# Patient Record
Sex: Female | Born: 1965
Health system: Southern US, Community
[De-identification: ages and names within clinical notes are randomized; demographics above are authoritative.]

## PROBLEM LIST (undated history)

## (undated) DIAGNOSIS — F419 Anxiety disorder, unspecified: Secondary | ICD-10-CM

## (undated) DIAGNOSIS — E559 Vitamin D deficiency, unspecified: Secondary | ICD-10-CM

## (undated) HISTORY — DX: Vitamin D deficiency, unspecified: E55.9

---

## 2003-09-20 HISTORY — PX: CYST EXCISION: SHX5701

## 2012-07-20 ENCOUNTER — Emergency Department (HOSPITAL_COMMUNITY): Payer: 59

## 2012-07-20 ENCOUNTER — Emergency Department (HOSPITAL_COMMUNITY)
Admission: EM | Admit: 2012-07-20 | Discharge: 2012-07-20 | Disposition: A | Discharge status: Home / Self Care | Payer: 59 | Attending: Emergency Medicine | Admitting: Emergency Medicine

## 2012-07-20 ENCOUNTER — Encounter (HOSPITAL_COMMUNITY): Payer: Self-pay

## 2012-07-20 DIAGNOSIS — X500XXA Overexertion from strenuous movement or load, initial encounter: Secondary | ICD-10-CM | POA: Insufficient documentation

## 2012-07-20 DIAGNOSIS — S8991XA Unspecified injury of right lower leg, initial encounter: Secondary | ICD-10-CM

## 2012-07-20 DIAGNOSIS — S86809A Unspecified injury of other muscle(s) and tendon(s) at lower leg level, unspecified leg, initial encounter: Secondary | ICD-10-CM

## 2012-07-20 DIAGNOSIS — Y9302 Activity, running: Secondary | ICD-10-CM | POA: Insufficient documentation

## 2012-07-20 DIAGNOSIS — S8990XA Unspecified injury of unspecified lower leg, initial encounter: Secondary | ICD-10-CM | POA: Insufficient documentation

## 2012-07-20 DIAGNOSIS — Y92410 Unspecified street and highway as the place of occurrence of the external cause: Secondary | ICD-10-CM | POA: Insufficient documentation

## 2012-07-20 MED ORDER — HYDROCODONE-ACETAMINOPHEN 5-325 MG PO TABS: ORAL_TABLET | ORAL | Status: DC

## 2012-07-20 MED ORDER — NAPROXEN 500 MG PO TABS: 500.0000 mg | ORAL_TABLET | Freq: Two times a day (BID) | ORAL | Status: DC

## 2012-07-20 NOTE — ED Notes (Signed)
Pt c/o right lower leg pain for 2 weeks began hurting worse after running the previous day.

## 2012-07-20 NOTE — ED Provider Notes (Signed)
History     CSN: [id number]  Arrival date & time 07/20/12  1478   First MD Initiated Contact with Patient 07/20/12 [phone]      Chief Complaint  Patient presents with  . Leg Pain    (Consider location/radiation/quality/duration/timing/severity/associated sxs/prior treatment) HPI Comments: Patient c/o pain to her right lower leg that began suddenly on the evening prior to ED arrival.  States that she began jogging down a hill while trick or treating with her child and felt a sharp, sudden pain to her right lower leg.  States she also felt a "pop" in her leg and she was unable to bear weight to the leg.  She denies numbness, redness or swelling or her leg.  She denies hx of smoking or previous DVT.    Patient is a 46 y.o. female presenting with leg pain. The history is provided by the patient.  Leg Pain  Incident onset: on the evening PTA. The incident occurred in the street. The pain is present in the right leg. The quality of the pain is described as throbbing and aching. The pain is moderate. The pain has been constant since onset. Associated symptoms include inability to bear weight. Pertinent negatives include no numbness, no loss of motion, no muscle weakness, no loss of sensation and no tingling. She reports no foreign bodies present. The symptoms are aggravated by activity, palpation and bearing weight. She has tried NSAIDs, ice and elevation for the symptoms. The treatment provided no relief.    History reviewed. No pertinent past medical history.  History reviewed. No pertinent past surgical history.  History reviewed. No pertinent family history.  History  Substance Use Topics  . Smoking status: Never Smoker   . Smokeless tobacco: Not on file  . Alcohol Use: Yes    OB History    Grav Para Term Preterm Abortions TAB SAB Ect Mult Living                  Review of Systems  Constitutional: Negative for fever and chills.  HENT: Negative for neck pain.   Respiratory:  Negative for shortness of breath.   Cardiovascular: Negative for chest pain.  Gastrointestinal: Negative for abdominal pain.  Genitourinary: Negative for dysuria and difficulty urinating.  Musculoskeletal: Positive for myalgias and gait problem. Negative for back pain, joint swelling and arthralgias.  Skin: Negative for color change and wound.  Neurological: Negative for tingling, weakness and numbness.  All other systems reviewed and are negative.    Allergies  Review of patient's allergies indicates no known allergies.  Home Medications  No current outpatient prescriptions on file.  BP 127/87  Pulse 95  Temp 97.9 F (36.6 C) (Oral)  Resp 20  SpO2 98%  LMP 07/13/2012  Physical Exam  Nursing note and vitals reviewed. Constitutional: She is oriented to person, place, and time. She appears well-developed and well-nourished. No distress.  HENT:  Head: Normocephalic and atraumatic.  Cardiovascular: Normal rate, regular rhythm, normal heart sounds and intact distal pulses.   No murmur heard. Pulmonary/Chest: Effort normal and breath sounds normal. No respiratory distress.  Musculoskeletal: She exhibits tenderness. She exhibits no edema.       Right lower leg: She exhibits tenderness. She exhibits no bony tenderness, no swelling, no edema, no deformity and no laceration.       Legs:      Localized ttp of the medial aspect of the right lower leg. Mild STS.  Thompson test is negative.  DP pulse  brisk, distal sensation intact, no erythema, bruising or excessive warmth  Neurological: She is alert and oriented to person, place, and time. She exhibits normal muscle tone. Coordination normal.  Skin: Skin is warm and dry.    ED Course  Procedures (including critical care time)  Labs Reviewed - No data to display  Mr Tibia Fibula Right Wo Contrast  07/20/2012  *RADIOLOGY REPORT*  Clinical Data: Right calf pain following injury running.  MRI OF THE RIGHT LOWER LEG WITHOUT CONTRAST   Technique:  Multiplanar, multisequence MR imaging of the lower right extremity was performed. No intravenous contrast was administered.  Comparison: None.  Findings: Study was performed using the body coil.  Both lower legs are included on the axial and coronal images.  There is moderate edema and probable hemorrhage in the mid right calf between the soleus muscle and the medial head of the gastrocnemius muscle.  This has a slightly tubular shape and is most consistent with plantaris tendon rupture. There is mild edema within the medial head of the gastrocnemius muscle distally.  The Achilles tendon appears normal.  No other fluid collections are identified.  There are no vascular abnormalities.  The right tibia and fibula appear normal.  Incidental imaging of the left lower leg demonstrates no abnormality.  IMPRESSION: Right lower leg plantaris rupture with typical edema/hemorrhage between the soleus and gastrocnemius muscles as described.   Original Report Authenticated By: Carey Bullocks, M.D.      Knee immobilizer applied, pain improved.  Remains NV intact.    MDM    Wells score for DVT is -1,  Low risk group for DVT  MR results discussed with pt.     10:00 am Consulted Dr. Hilda Lias.  Recommended MRI, knee immobilizer and he will see pt in his office on Monday  Rx: Naprosyn norco #24   Ramonita Koenig L. Kartier Bennison, PA 07/21/12 2142  Tegan Burnside L. [place], [place] 07/21/12 2143

## 2012-07-20 NOTE — ED Notes (Signed)
Pt reports pain primarily to her rt calf.  Pt reports pain increases upon movement.  There was no redness or swelling noted.

## 2012-07-28 NOTE — ED Provider Notes (Signed)
Medical screening examination/treatment/procedure(s) were performed by non-physician practitioner and as supervising physician I was immediately available for consultation/collaboration. Devoria Albe, MD, Armando Gang   Ward Givens, MD 07/28/12 [phone]

## 2013-08-19 ENCOUNTER — Encounter: Payer: Self-pay | Admitting: Family Medicine

## 2013-08-19 ENCOUNTER — Ambulatory Visit (INDEPENDENT_AMBULATORY_CARE_PROVIDER_SITE_OTHER): Payer: 59 | Admitting: Family Medicine

## 2013-08-19 VITALS — BP 114/88 | Temp 98.8°F | Ht 65.5 in | Wt 166.2 lb

## 2013-08-19 DIAGNOSIS — J329 Chronic sinusitis, unspecified: Secondary | ICD-10-CM

## 2013-08-19 DIAGNOSIS — J310 Chronic rhinitis: Secondary | ICD-10-CM

## 2013-08-19 MED ORDER — CEFDINIR 300 MG PO CAPS: 300.0000 mg | ORAL_CAPSULE | Freq: Two times a day (BID) | ORAL | Status: DC

## 2013-08-19 NOTE — Progress Notes (Signed)
   Subjective:    Patient ID: Amanda Woodard, female    DOB: Jan 06, 1966, 47 y.o.   MRN: [id number]  Sinusitis This is a new problem. The current episode started in the past 7 days. Associated symptoms include congestion, coughing, ear pain, headaches, a hoarse voice and sinus pressure. Past treatments include acetaminophen. The treatment provided mild relief.  med ankle pain on the left Recalls no injury to ankle. Pain sharp at times. Due to fly today. Works as a Financial controller  Low gr fever Wheezing at times at times with exertion, nose running diminished endrgy  Review of Systems  HENT: Positive for congestion, ear pain, hoarse voice and sinus pressure.   Respiratory: Positive for cough.   Neurological: Positive for headaches.       Objective:   Physical Exam   alert mild malaise. H&T moderate his congestion frontal and maxillary tenderness pharynx normal neck supple lungs clear heart rare rhythm medial left ankle swollen tender to palpation malleolus nontender      Assessment & Plan:  Impression 1 rhinosinusitis #2 tenia synovitis plan antibiotics prescribed. Since Medicare discussed. WSL

## 2014-05-22 ENCOUNTER — Encounter: Payer: Self-pay | Admitting: Nurse Practitioner

## 2014-05-22 ENCOUNTER — Ambulatory Visit (INDEPENDENT_AMBULATORY_CARE_PROVIDER_SITE_OTHER): Payer: 59 | Admitting: Nurse Practitioner

## 2014-05-22 VITALS — BP 116/82 | Ht 65.5 in | Wt 168.0 lb

## 2014-05-22 DIAGNOSIS — T148 Other injury of unspecified body region: Secondary | ICD-10-CM

## 2014-05-22 DIAGNOSIS — M722 Plantar fascial fibromatosis: Secondary | ICD-10-CM

## 2014-05-22 DIAGNOSIS — W57XXXA Bitten or stung by nonvenomous insect and other nonvenomous arthropods, initial encounter: Secondary | ICD-10-CM

## 2014-05-22 NOTE — Progress Notes (Signed)
Subjective:  Presents for complaints of a rash on her left lower leg for the past several days. Began after doing some yard work, was wearing yoga pants. Localized to the left lower leg. Nonpruritic. Slightly tender. Has been applying clobetasol. Initially slightly pink and swollen, this has improved. Also complaints of pain in the sole of the right foot near the heel, localized. No specific history of injury. Worse when she first puts weight on her foot first thing in the morning or after sitting for a long period of time. No fever. No headache. No joint pain.  Objective:   BP 116/82  Ht 5' 5.5" (1.664 m)  Wt 168 lb (76.204 kg)  BMI 27.52 kg/m2 NAD. Alert, oriented. Cluster of flat open healing papules with mild surrounding faint purplish discoloration. Minimal edema around area. Nontender to palpation. Located on the left lower posterior leg. Right heel localized tenderness noted at the distal portion of the heel in the arch of the foot. Patient wearing hard soled sandals with minimal arch support. Moderate arch noted in the foot.  Assessment: Plantar fasciitis of right foot  Insect bites  Plan: May continue clobetasol, expect continued gradual resolution of rash. Given written and verbal information on plantar fasciitis. Recommend referral to podiatry if symptoms persist. Call back if any further problems.

## 2014-05-22 NOTE — Patient Instructions (Signed)
Plantar Fasciitis  Plantar fasciitis is a common condition that causes foot pain. It is soreness (inflammation) of the band of tough fibrous tissue on the bottom of the foot that runs from the heel bone (calcaneus) to the ball of the foot. The cause of this soreness may be from excessive standing, poor fitting shoes, running on hard surfaces, being overweight, having an abnormal walk, or overuse (this is common in runners) of the painful foot or feet. It is also common in aerobic exercise dancers and ballet dancers.  SYMPTOMS   Most people with plantar fasciitis complain of:   Severe pain in the morning on the bottom of their foot especially when taking the first steps out of bed. This pain recedes after a few minutes of walking.   Severe pain is experienced also during walking following a long period of inactivity.   Pain is worse when walking barefoot or up stairs  DIAGNOSIS    Your caregiver will diagnose this condition by examining and feeling your foot.   Special tests such as X-rays of your foot, are usually not needed.  PREVENTION    Consult a sports medicine professional before beginning a new exercise program.   Walking programs offer a good workout. With walking there is a lower chance of overuse injuries common to runners. There is less impact and less jarring of the joints.   Begin all new exercise programs slowly. If problems or pain develop, decrease the amount of time or distance until you are at a comfortable level.   Wear good shoes and replace them regularly.   Stretch your foot and the heel cords at the back of the ankle (Achilles tendon) both before and after exercise.   Run or exercise on even surfaces that are not hard. For example, asphalt is better than pavement.   Do not run barefoot on hard surfaces.   If using a treadmill, vary the incline.   Do not continue to workout if you have foot or joint problems. Seek professional help if they do not improve.  HOME CARE INSTRUCTIONS     Avoid activities that cause you pain until you recover.   Use ice or cold packs on the problem or painful areas after working out.   Only take over-the-counter or prescription medicines for pain, discomfort, or fever as directed by your caregiver.   Soft shoe inserts or athletic shoes with air or gel sole cushions may be helpful.   If problems continue or become more severe, consult a sports medicine caregiver or your own health care provider. Cortisone is a potent anti-inflammatory medication that may be injected into the painful area. You can discuss this treatment with your caregiver.  MAKE SURE YOU:    Understand these instructions.   Will watch your condition.   Will get help right away if you are not doing well or get worse.  Document Released: 05/31/2001 Document Revised: 11/28/2011 Document Reviewed: 07/30/2008  ExitCare Patient Information 2015 [employer]. This information is not intended to replace advice given to you by your health care provider. Make sure you discuss any questions you have with your health care provider.

## 2014-06-27 ENCOUNTER — Telehealth: Payer: Self-pay | Admitting: Nurse Practitioner

## 2014-06-27 NOTE — Telephone Encounter (Signed)
Patient needs order for blood work. °

## 2014-07-02 ENCOUNTER — Telehealth: Payer: Self-pay | Admitting: *Deleted

## 2014-07-02 ENCOUNTER — Other Ambulatory Visit: Payer: Self-pay | Admitting: Nurse Practitioner

## 2014-07-02 DIAGNOSIS — Z0000 Encounter for general adult medical examination without abnormal findings: Secondary | ICD-10-CM

## 2014-07-02 DIAGNOSIS — Z13220 Encounter for screening for lipoid disorders: Secondary | ICD-10-CM

## 2014-07-02 NOTE — Telephone Encounter (Signed)
Pt notified lab orders are ready.

## 2014-07-02 NOTE — Telephone Encounter (Signed)
Pt notified orders ready.

## 2014-07-02 NOTE — Telephone Encounter (Signed)
Labs ordered.

## 2014-07-29 LAB — LIPID PANEL
Cholesterol: 159 mg/dL (ref 0–200)
HDL: 42 mg/dL (ref >39)
LDL CALC: 94 mg/dL (ref 0–99)
Total CHOL/HDL Ratio: 3.8 Ratio
Triglycerides: 114 mg/dL (ref <150)
VLDL: 23 mg/dL (ref 0–40)

## 2014-07-29 LAB — CBC WITH DIFFERENTIAL/PLATELET
Basophils Absolute: 0 10*3/uL (ref 0.0–0.1)
Basophils Relative: 0 % (ref 0–1)
EOS PCT: 2 % (ref 0–5)
Eosinophils Absolute: 0.1 10*3/uL (ref 0.0–0.7)
HEMATOCRIT: 46.4 % — AB (ref 36.0–46.0)
HEMOGLOBIN: 15.2 g/dL — AB (ref 12.0–15.0)
LYMPHS PCT: 31 % (ref 12–46)
Lymphs Abs: 1.6 10*3/uL (ref 0.7–4.0)
MCH: 30.7 pg (ref 26.0–34.0)
MCHC: 32.8 g/dL (ref 30.0–36.0)
MCV: 93.7 fL (ref 78.0–100.0)
MONO ABS: 0.5 10*3/uL (ref 0.1–1.0)
MONOS PCT: 10 % (ref 3–12)
NEUTROS ABS: 3 10*3/uL (ref 1.7–7.7)
Neutrophils Relative %: 57 % (ref 43–77)
Platelets: 225 10*3/uL (ref 150–400)
RBC: 4.95 MIL/uL (ref 3.87–5.11)
RDW: 12.9 % (ref 11.5–15.5)
WBC: 5.3 10*3/uL (ref 4.0–10.5)

## 2014-07-29 LAB — BASIC METABOLIC PANEL
BUN: 15 mg/dL (ref 6–23)
CALCIUM: 9.2 mg/dL (ref 8.4–10.5)
CO2: 27 meq/L (ref 19–32)
Chloride: 106 mEq/L (ref 96–112)
Creat: 0.74 mg/dL (ref 0.50–1.10)
GLUCOSE: 95 mg/dL (ref 70–99)
POTASSIUM: 4.5 meq/L (ref 3.5–5.3)
SODIUM: 139 meq/L (ref 135–145)

## 2014-07-29 LAB — HEPATIC FUNCTION PANEL
ALK PHOS: 29 U/L — AB (ref 39–117)
ALT: 18 U/L (ref 0–35)
AST: 14 U/L (ref 0–37)
Albumin: 4.4 g/dL (ref 3.5–5.2)
BILIRUBIN DIRECT: 0.1 mg/dL (ref 0.0–0.3)
BILIRUBIN INDIRECT: 0.4 mg/dL (ref 0.2–1.2)
TOTAL PROTEIN: 6.6 g/dL (ref 6.0–8.3)
Total Bilirubin: 0.5 mg/dL (ref 0.2–1.2)

## 2014-07-29 LAB — TSH: TSH: 1.173 u[IU]/mL (ref 0.350–4.500)

## 2014-07-30 LAB — VITAMIN D 25 HYDROXY (VIT D DEFICIENCY, FRACTURES): VIT D 25 HYDROXY: 30 ng/mL (ref 30–89)

## 2014-08-06 ENCOUNTER — Encounter: Payer: 59 | Admitting: Nurse Practitioner

## 2014-08-07 ENCOUNTER — Encounter: Payer: Self-pay | Admitting: Nurse Practitioner

## 2014-08-07 ENCOUNTER — Ambulatory Visit (INDEPENDENT_AMBULATORY_CARE_PROVIDER_SITE_OTHER): Payer: 59 | Admitting: Nurse Practitioner

## 2014-08-07 VITALS — BP 124/80 | Ht 65.0 in | Wt 169.2 lb

## 2014-08-07 DIAGNOSIS — Z0000 Encounter for general adult medical examination without abnormal findings: Secondary | ICD-10-CM

## 2014-08-07 DIAGNOSIS — Z23 Encounter for immunization: Secondary | ICD-10-CM

## 2014-08-07 DIAGNOSIS — G4700 Insomnia, unspecified: Secondary | ICD-10-CM

## 2014-08-07 NOTE — Patient Instructions (Signed)
Weight Watchers 21 day fix

## 2014-08-07 NOTE — Progress Notes (Addendum)
Subjective:  Presents for recheck and discuss labs. Having insomnia a few nights per week; relieved with 1/2-1 Xanax 0.5 at bedtime. Prescribed sixty by GYN. Tries to use on a rare basis. Works as a Catering manager. Gets mammogram and physicals every August with gyn in [place].  Objective:   BP 124/80 mmHg  Ht 5\' 5"  (1.651 m)  Wt 169 lb 4 oz (76.771 kg)  BMI 28.16 kg/m2 NAD. Alert. Oriented. Cheerful affect. Lungs clear. Heart RRR. Reviewed labs from 07/29/14 with patient. Form filled out for work.   Assessment: Insomnia  Need for vaccination - Plan: Flu Vaccine QUAD 36+ mos PF IM (Fluarix Quad PF)  Plan: Meds ordered this encounter  Medications  . ALPRAZolam (XANAX) 0.5 MG tablet    Sig: 0.5 mg at bedtime as needed.    Refill:  0   Use Xanax up to 2-3 times per week. Recheck if insomnia or stress worsens.   See phone call dated 09/30/14. Patient has been advised that her insurance may not pay for one of the visits depending on how her GYN is coding. We did review labs and general screening recommended for patient as a request from her employer. Also forms for her physical were filled out for work.

## 2014-08-23 ENCOUNTER — Encounter: Payer: Self-pay | Admitting: *Deleted

## 2014-09-30 ENCOUNTER — Telehealth: Payer: Self-pay | Admitting: Nurse Practitioner

## 2014-09-30 NOTE — Telephone Encounter (Signed)
Patient was seen on 08/07/2014 for her annual exam she says.  The diagnosis was put in as insomnia, so her HSA account came back and took care of the balance.  Patient says this was supposed to be a routine exam so that her insurance would cover 100%.  In the office notes, it does say that she gets her mammograms and paps in [place] at gynecologist, but patient says this does not include her routine physical exams.  She wants to know if Hoyle Sauer can change the diagnosis to represent that this was a routine physical exam?  Please call when this has been completed.

## 2015-05-28 ENCOUNTER — Other Ambulatory Visit (HOSPITAL_COMMUNITY)
Admission: RE | Admit: 2015-05-28 | Discharge: 2015-05-28 | Disposition: A | Discharge status: Home / Self Care | Payer: 59 | Source: Ambulatory Visit | Attending: Family Medicine | Admitting: Family Medicine

## 2015-05-28 ENCOUNTER — Ambulatory Visit (INDEPENDENT_AMBULATORY_CARE_PROVIDER_SITE_OTHER): Payer: 59 | Admitting: Family Medicine

## 2015-05-28 ENCOUNTER — Ambulatory Visit (HOSPITAL_COMMUNITY)
Admission: RE | Admit: 2015-05-28 | Discharge: 2015-05-28 | Disposition: A | Discharge status: Home / Self Care | Payer: 59 | Source: Ambulatory Visit | Attending: Family Medicine | Admitting: Family Medicine

## 2015-05-28 ENCOUNTER — Encounter: Payer: Self-pay | Admitting: Family Medicine

## 2015-05-28 VITALS — BP 112/68 | Ht 65.0 in | Wt 165.0 lb

## 2015-05-28 DIAGNOSIS — M79661 Pain in right lower leg: Secondary | ICD-10-CM | POA: Diagnosis not present

## 2015-05-28 LAB — D-DIMER, QUANTITATIVE: D-Dimer, Quant: <0.27 ug/mL-FEU (ref 0.00–0.48)

## 2015-05-28 NOTE — Progress Notes (Signed)
   Subjective:    Patient ID: Amanda Woodard, female    DOB: 19-Nov-1965, 49 y.o.   MRN: [id number]  HPI Patient arrives with c/o painful veins on back of her right leg. Past 5 days with aching Sx come and go Worse at night The pain is been in the superior posterior portion of the calf for no good reason over the past 5 days she is noticed it several times where aches really bad. 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  Review of Systems Denies wheezing chills fevers cough hemoptysis    Objective:   Physical Exam  On physical examination lungs clear hearts regular pulse normal there is some tenderness behind the right leg with some tenderness in the superior portion of the calf. Left leg nontender. There does not appear to be any significant swelling.      Assessment & Plan:  With the Pain and tenderness I believe this patient does need d-dimer as well as ultrasound of this we await the results of this to help rule out DVT if DVT is negative then will use anti-inflammatory some stretching exercises  It should be noted that ultrasound that negative for DVT. D-dimer came back negative. No need for further intervention. Stretching exercises and time for laboratory's may be done. Patient was spoken with via phone. Follow-up if problems.

## 2015-10-27 ENCOUNTER — Ambulatory Visit (INDEPENDENT_AMBULATORY_CARE_PROVIDER_SITE_OTHER): Payer: 59 | Admitting: Family Medicine

## 2015-10-27 ENCOUNTER — Encounter: Payer: Self-pay | Admitting: Family Medicine

## 2015-10-27 VITALS — BP 112/70 | Temp 98.2°F | Ht 65.0 in | Wt 167.0 lb

## 2015-10-27 DIAGNOSIS — J019 Acute sinusitis, unspecified: Secondary | ICD-10-CM

## 2015-10-27 DIAGNOSIS — B9689 Other specified bacterial agents as the cause of diseases classified elsewhere: Secondary | ICD-10-CM

## 2015-10-27 DIAGNOSIS — H6981 Other specified disorders of Eustachian tube, right ear: Secondary | ICD-10-CM | POA: Diagnosis not present

## 2015-10-27 MED ORDER — CEFPROZIL 500 MG PO TABS: 500.0000 mg | ORAL_TABLET | Freq: Two times a day (BID) | ORAL | Status: DC

## 2015-10-27 NOTE — Progress Notes (Signed)
   Subjective:    Patient ID: Amanda Woodard, female    DOB: 11-06-1965, 50 y.o.   MRN: [id number]  Sinusitis This is a new problem. The current episode started in the past 7 days. The problem is unchanged. The pain is moderate. Associated symptoms include congestion, coughing, ear pain and sneezing. Pertinent negatives include no shortness of breath. Treatments tried: allergy medicine. The treatment provided no relief.   Patient states that she has no other concerns at this time.    patients had some head congestion drainage coughing a little bit of hoarseness denies high fever chills sweats nausea vomiting relates it have some inner ears symptoms but this got better   Review of Systems  Constitutional: Negative for fever and activity change.  HENT: Positive for congestion, ear pain, rhinorrhea and sneezing.   Eyes: Negative for discharge.  Respiratory: Positive for cough. Negative for shortness of breath and wheezing.   Cardiovascular: Negative for chest pain.       Objective:   Physical Exam  Constitutional: She appears well-developed.  HENT:  Head: Normocephalic.  Nose: Nose normal.  Mouth/Throat: Oropharynx is clear and moist. No oropharyngeal exudate.  Neck: Neck supple.  Cardiovascular: Normal rate and normal heart sounds.   No murmur heard. Pulmonary/Chest: Effort normal and breath sounds normal. She has no wheezes.  Lymphadenopathy:    She has no cervical adenopathy.  Skin: Skin is warm and dry.  Nursing note and vitals reviewed.         Assessment & Plan:   eustachian tube dysfunction should gradually get better may use nasal decongestant if necessary Sinusitis antibiotics prescribed warning signs discussed follow-up if problems

## 2016-05-18 ENCOUNTER — Encounter: Payer: Self-pay | Admitting: Family Medicine

## 2016-05-18 ENCOUNTER — Ambulatory Visit (INDEPENDENT_AMBULATORY_CARE_PROVIDER_SITE_OTHER): Payer: 59 | Admitting: Family Medicine

## 2016-05-18 VITALS — BP 118/76 | Temp 98.3°F | Ht 65.5 in | Wt 169.0 lb

## 2016-05-18 DIAGNOSIS — J019 Acute sinusitis, unspecified: Secondary | ICD-10-CM

## 2016-05-18 DIAGNOSIS — B9689 Other specified bacterial agents as the cause of diseases classified elsewhere: Secondary | ICD-10-CM

## 2016-05-18 MED ORDER — AMOXICILLIN-POT CLAVULANATE 875-125 MG PO TABS: 1.0000 | ORAL_TABLET | Freq: Two times a day (BID) | ORAL | 0 refills | Status: AC

## 2016-05-18 NOTE — Progress Notes (Signed)
   Subjective:    Patient ID: Amanda Woodard, female    DOB: 05-05-1966, 50 y.o.   MRN: [id number]  Sinusitis  This is a new problem. Episode onset: 3 days. Associated symptoms include congestion, coughing, ear pain, headaches and a sore throat. Treatments tried: zyrtec, ibuprofen, benadryl.   Muffled  Headache  Pressure frontal dim energy     Using zyrtec, bendadryl and   No know n exposure  No fam stuff     Review of Systems  HENT: Positive for congestion, ear pain and sore throat.   Respiratory: Positive for cough.   Neurological: Positive for headaches.       Objective:   Physical Exam Alert, mild malaise. Hydration good Vitals stable. frontal/ maxillary tenderness evident positive nasal congestion. pharynx normal neck supple  lungs clear/no crackles or wheezes. heart regular in rhythm        Assessment & Plan:  Impression rhinosinusitis likely post viral, discussed with patient. plan antibiotics prescribed. Questions answered. Symptomatic care discussed. warning signs discussed. WSL

## 2016-06-16 ENCOUNTER — Ambulatory Visit (INDEPENDENT_AMBULATORY_CARE_PROVIDER_SITE_OTHER): Payer: 59 | Admitting: Nurse Practitioner

## 2016-06-16 ENCOUNTER — Encounter: Payer: Self-pay | Admitting: Nurse Practitioner

## 2016-06-16 VITALS — BP 122/80 | Ht 65.0 in | Wt 169.2 lb

## 2016-06-16 DIAGNOSIS — Z0000 Encounter for general adult medical examination without abnormal findings: Secondary | ICD-10-CM | POA: Diagnosis not present

## 2016-06-16 DIAGNOSIS — Z23 Encounter for immunization: Secondary | ICD-10-CM

## 2016-06-16 DIAGNOSIS — Z789 Other specified health status: Secondary | ICD-10-CM

## 2016-06-16 DIAGNOSIS — Z131 Encounter for screening for diabetes mellitus: Secondary | ICD-10-CM

## 2016-06-16 LAB — POCT GLYCOSYLATED HEMOGLOBIN (HGB A1C): Hemoglobin A1C: 5.1

## 2016-06-16 MED ORDER — ALPRAZOLAM 0.5 MG PO TABS: 0.5000 mg | ORAL_TABLET | Freq: Every evening | ORAL | 5 refills | Status: DC | PRN

## 2016-06-16 NOTE — Progress Notes (Signed)
Subjective:  Presents with her wellness form for work. Had her lab work done the end of last week. Active. Tries to get 8000-11000 steps in per day. No chest pain/ischemic type pain or shortness of breath. Nonsmoker. Gets regular women's preventive health physicals mammograms through her gynecologist.  Objective:   BP 122/80   Ht 5\' 5"  (1.651 m)   Wt 169 lb 4 oz (76.8 kg)   BMI 28.16 kg/m  NAD. Alert, oriented. Lungs clear. Heart regular rate rhythm.  Assessment: Participant in health and wellness plan  Need for vaccination - Plan: Flu Vaccine QUAD 36+ mos PF IM (Fluarix & Fluzone Quad PF)  Screening for diabetes mellitus - Plan: POCT glycosylated hemoglobin (Hb A1C)  Plan:  Meds ordered this encounter  Medications  . ALPRAZolam (XANAX) 0.5 MG tablet    Sig: Take 1 tablet (0.5 mg total) by mouth at bedtime as needed.    Dispense:  30 tablet    Refill:  5   Refill Xanax that she takes mainly at bedtime for sleep as needed. Paperwork filled out for her insurance. Recheck here as needed.

## 2016-06-27 ENCOUNTER — Ambulatory Visit (INDEPENDENT_AMBULATORY_CARE_PROVIDER_SITE_OTHER): Payer: 59 | Admitting: Family Medicine

## 2016-06-27 ENCOUNTER — Encounter: Payer: Self-pay | Admitting: Family Medicine

## 2016-06-27 VITALS — BP 114/82 | Temp 98.2°F | Ht 65.5 in | Wt 166.5 lb

## 2016-06-27 DIAGNOSIS — B9689 Other specified bacterial agents as the cause of diseases classified elsewhere: Secondary | ICD-10-CM | POA: Diagnosis not present

## 2016-06-27 DIAGNOSIS — J019 Acute sinusitis, unspecified: Secondary | ICD-10-CM

## 2016-06-27 MED ORDER — BENZONATATE 100 MG PO CAPS: 100.0000 mg | ORAL_CAPSULE | Freq: Three times a day (TID) | ORAL | 0 refills | Status: DC | PRN

## 2016-06-27 MED ORDER — CEFPROZIL 500 MG PO TABS: 500.0000 mg | ORAL_TABLET | Freq: Two times a day (BID) | ORAL | 0 refills | Status: DC

## 2016-06-27 NOTE — Progress Notes (Signed)
   Subjective:    Patient ID: Amanda Woodard, female    DOB: January 08, 1966, 50 y.o.   MRN: [id number]  Sore Throat   This is a new problem. The current episode started in the past 7 days. Associated symptoms include congestion, coughing, ear pain and headaches. Associated symptoms comments: Runny nose,wheezing. She has tried NSAIDs and gargles (Delysum, Advil cold and sinus, salt water gargles, advil) for the symptoms.   Patient states no other concerns this visit.  Wed hit with cong and ranage and sore throat  No sig fever. Fe;t achey  Dim enrgy    Worse at night better during the day, very bad sore throat   Took advil cold and sinus and mucines  And dlesym Review of Systems  HENT: Positive for congestion and ear pain.   Respiratory: Positive for cough.   Neurological: Positive for headaches.       Objective:   Physical Exam  Alert, mild malaise. Hydration good Vitals stable. frontal/ maxillary tenderness evident positive nasal congestion. pharynx normal neck supple  lungs clear/no crackles or wheezes. heart regular in rhythm       Assessment & Plan:  Impression rhinosinusitis likely post viral, discussed with patient. plan antibiotics prescribed. Questions answered. Symptomatic care discussed. warning signs discussed. WSL

## 2016-07-05 ENCOUNTER — Telehealth: Payer: Self-pay | Admitting: Nurse Practitioner

## 2016-07-05 NOTE — Telephone Encounter (Signed)
Patient was seen on 06/16/16 by Hoyle Sauer.  She says this was supposed to be coded as a preventative visit so that she will get the points towards her insurance for the year.  She said she has not had a chance to go to her OBGYN this year and wanted this visit coded as a physical.  Can we amend the chart to show this?

## 2016-07-06 NOTE — Telephone Encounter (Signed)
Spoke with patient and informed her per Linzie Collin- The wellness plans are complicated with coding. I went back and changed coding on everything. Now the problem is will they pay for her GYN exam if that code is used again. Let me know if this does not go through. Patient verbalized understanding.

## 2016-07-06 NOTE — Telephone Encounter (Signed)
The wellness plans are complicated with coding. I went back and changed coding on everything. Now the problem is will they pay for her GYN exam if that code is used again. Let me know if this does not go through.

## 2016-07-13 ENCOUNTER — Telehealth: Payer: Self-pay | Admitting: Family Medicine

## 2016-07-13 MED ORDER — HYDROCODONE-HOMATROPINE 5-1.5 MG/5ML PO SYRP: 5.0000 mL | ORAL_SOLUTION | Freq: Every evening | ORAL | 0 refills | Status: DC | PRN

## 2016-07-13 NOTE — Telephone Encounter (Signed)
Prescription upfront for pick up. Patient notified. 

## 2016-07-13 NOTE — Telephone Encounter (Signed)
Patient seen on 06/27/16 for rhinosinusitis.  She was given Tesslon pearls for her cough, but its not helping.   She said her cough keeps her up at night and she has coughed so much that her sides are sore.  She wants to know if we can call in something stronger for her cough.

## 2016-07-13 NOTE — Telephone Encounter (Signed)
Hycodan 3 oz one tspn qhs prn cough 

## 2016-07-21 ENCOUNTER — Telehealth: Payer: Self-pay | Admitting: Family Medicine

## 2016-07-21 NOTE — Telephone Encounter (Signed)
Spoke with patient regarding Claim from September 2017. Explained that the claim was held after the code was changed and it has been submitted as of today. Pt voiced understanding.

## 2016-11-30 DIAGNOSIS — S92504A Nondisplaced unspecified fracture of right lesser toe(s), initial encounter for closed fracture: Secondary | ICD-10-CM | POA: Diagnosis not present

## 2017-04-18 ENCOUNTER — Ambulatory Visit (INDEPENDENT_AMBULATORY_CARE_PROVIDER_SITE_OTHER): Payer: 59 | Admitting: Nurse Practitioner

## 2017-04-18 ENCOUNTER — Encounter: Payer: Self-pay | Admitting: Nurse Practitioner

## 2017-04-18 VITALS — BP 112/82 | Ht 65.0 in | Wt 172.1 lb

## 2017-04-18 DIAGNOSIS — M79672 Pain in left foot: Secondary | ICD-10-CM

## 2017-04-18 MED ORDER — PREDNISONE 20 MG PO TABS: ORAL_TABLET | ORAL | 0 refills | Status: DC

## 2017-04-18 NOTE — Progress Notes (Signed)
Subjective:  Presents for complaints of localized pain and swelling near the ball of the left foot that began 3 days ago. Started after 2 days of walking for exercise. No specific history of injury. No fevers. Pain with pressure on the area while walking. Has tried ibuprofen and ice packs with minimal relief.  Objective:   BP 112/82   Ht 5\' 5"  (1.651 m)   Wt 172 lb 0.8 oz (78 kg)   BMI 28.63 kg/m  NAD. Alert, oriented. Mild edema noted on the top of the left foot extending down into the medial foot near the base of the great toe. A small mobile nodule with swelling and distinct tenderness noted on the sole of the foot medial side near the ball of the foot. Strong DP pulse left foot with warm toes and normal capillary refill.  Assessment:  Left foot pain    Plan:   Meds ordered this encounter  Medications  . predniSONE (DELTASONE) 20 MG tablet    Sig: 3 po qd x 3 d then 2 po qd x 3 d then 1 po qd x 2 d    Dispense:  17 tablet    Refill:  0    Order Specific Question:   Supervising Provider    Answer:   Mikey Kirschner [[id number]]   Continue ice packs. Call back in 72 hours if no improvement, sooner if needed.

## 2017-04-21 ENCOUNTER — Encounter: Payer: Self-pay | Admitting: Nurse Practitioner

## 2017-04-24 ENCOUNTER — Telehealth: Payer: Self-pay | Admitting: Family Medicine

## 2017-04-24 NOTE — Telephone Encounter (Signed)
Pt called to touch base with Hoyle Sauer about the cyst on the bottom of her foot and is also wanting to go forward with the referral to the podiatrist. Pt states that it is still there but that it is better. Pt has been staying off of her foot. Pt has two pills left. Please advise.

## 2017-04-24 NOTE — Telephone Encounter (Signed)
Brendale, not sure if this needs referral or just sent information. Let me know if you need referral in the system. Thanks.

## 2017-05-01 NOTE — Telephone Encounter (Signed)
Hoyle Sauer sent pt info through [employer]

## 2017-05-04 DIAGNOSIS — Z1231 Encounter for screening mammogram for malignant neoplasm of breast: Secondary | ICD-10-CM | POA: Diagnosis not present

## 2017-05-04 DIAGNOSIS — Z01419 Encounter for gynecological examination (general) (routine) without abnormal findings: Secondary | ICD-10-CM | POA: Diagnosis not present

## 2017-05-15 DIAGNOSIS — M722 Plantar fascial fibromatosis: Secondary | ICD-10-CM | POA: Diagnosis not present

## 2017-05-15 DIAGNOSIS — M79675 Pain in left toe(s): Secondary | ICD-10-CM | POA: Diagnosis not present

## 2017-05-25 ENCOUNTER — Ambulatory Visit (INDEPENDENT_AMBULATORY_CARE_PROVIDER_SITE_OTHER): Payer: 59 | Admitting: Family Medicine

## 2017-05-25 VITALS — Temp 98.2°F | Ht 65.0 in | Wt 171.2 lb

## 2017-05-25 DIAGNOSIS — J019 Acute sinusitis, unspecified: Secondary | ICD-10-CM

## 2017-05-25 DIAGNOSIS — J301 Allergic rhinitis due to pollen: Secondary | ICD-10-CM | POA: Diagnosis not present

## 2017-05-25 MED ORDER — AMOXICILLIN-POT CLAVULANATE 875-125 MG PO TABS: 1.0000 | ORAL_TABLET | Freq: Two times a day (BID) | ORAL | 0 refills | Status: DC

## 2017-05-25 NOTE — Progress Notes (Signed)
   Subjective:    Patient ID: Amanda Woodard, female    DOB: [birth year], 51 y.o.   MRN: [id number]  Cough  This is a new problem. The current episode started 1 to 4 weeks ago. Associated symptoms include nasal congestion, rhinorrhea and a sore throat. Pertinent negatives include no chest pain, ear pain, fever, shortness of breath or wheezing. She has tried OTC cough suppressant (advil, allergy med) for the symptoms.  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  HENT: Positive for congestion, rhinorrhea and sore throat. Negative for ear pain.   Eyes: Negative for discharge.  Respiratory: Positive for cough. Negative for shortness of breath and wheezing.   Cardiovascular: Negative for chest pain.       Objective:   Physical Exam  Constitutional: She appears well-developed.  HENT:  Head: Normocephalic.  Right Ear: External ear normal.  Left Ear: External ear normal.  Nose: Nose normal.  Mouth/Throat: Oropharynx is clear and moist. No oropharyngeal exudate.  Eyes: Right eye exhibits no discharge. Left eye exhibits no discharge.  Neck: Neck supple. No tracheal deviation present.  Cardiovascular: Normal rate and normal heart sounds.   No murmur heard. Pulmonary/Chest: Effort normal and breath sounds normal. She has no wheezes. She has no rales.  Lymphadenopathy:    She has no cervical adenopathy.  Skin: Skin is warm and dry.  Nursing note and vitals reviewed.         Assessment & Plan:  Viral illness Acute rhinosinusitis Allergic rhinitis Allergy medicines OTC including fexofenadine and Flonase Antibiotics if not better over the next 24 hours Warning signs discussed in detail follow-up  if problems May use decongestants sparingly

## 2017-06-16 ENCOUNTER — Other Ambulatory Visit: Payer: Self-pay | Admitting: Nurse Practitioner

## 2017-06-16 ENCOUNTER — Telehealth: Payer: Self-pay | Admitting: Family Medicine

## 2017-06-16 MED ORDER — LEVOFLOXACIN 500 MG PO TABS: 500.0000 mg | ORAL_TABLET | Freq: Every day | ORAL | 0 refills | Status: DC

## 2017-06-16 NOTE — Telephone Encounter (Signed)
Antibiotic sent in. Call back if persists.

## 2017-06-16 NOTE — Telephone Encounter (Signed)
Patient was seen 9/6 with a sinus infection and now its moved to her chest and has a real bad cough.She is wanting something else called into [facility]

## 2017-06-16 NOTE — Telephone Encounter (Signed)
Patient notified

## 2017-06-23 ENCOUNTER — Telehealth: Payer: Self-pay | Admitting: Family Medicine

## 2017-06-23 MED ORDER — FLUCONAZOLE 150 MG PO TABS: ORAL_TABLET | ORAL | 0 refills | Status: DC

## 2017-06-23 NOTE — Telephone Encounter (Signed)
Sent in Diflucan 150 mg one po three days apart. # 2 per Protocol.Pt is aware we have sent in to [facility] [place].

## 2017-06-23 NOTE — Telephone Encounter (Signed)
Patient is requesting Rx for yeast infection called in to [facility].

## 2017-07-17 ENCOUNTER — Other Ambulatory Visit: Payer: Self-pay | Admitting: *Deleted

## 2017-07-17 ENCOUNTER — Telehealth: Payer: Self-pay | Admitting: Family Medicine

## 2017-07-17 MED ORDER — FLUCONAZOLE 150 MG PO TABS: ORAL_TABLET | ORAL | 0 refills | Status: DC

## 2017-07-17 NOTE — Telephone Encounter (Signed)
Wants something called in for a yeast infection. [facility] [address].

## 2017-07-17 NOTE — Telephone Encounter (Signed)
Having symptoms of yeast infection. Diflucan 150mg  #2 one po 3 days apart per dr Richardson Landry. Med sent to pharm. Pt notified med sent and to follow up in office if symptoms do not clear up.

## 2017-07-27 ENCOUNTER — Ambulatory Visit (INDEPENDENT_AMBULATORY_CARE_PROVIDER_SITE_OTHER): Payer: 59

## 2017-07-27 DIAGNOSIS — Z23 Encounter for immunization: Secondary | ICD-10-CM | POA: Diagnosis not present

## 2017-12-01 ENCOUNTER — Ambulatory Visit (INDEPENDENT_AMBULATORY_CARE_PROVIDER_SITE_OTHER): Payer: 59 | Admitting: Family Medicine

## 2017-12-01 ENCOUNTER — Encounter: Payer: Self-pay | Admitting: Family Medicine

## 2017-12-01 VITALS — BP 118/78 | Temp 98.7°F | Ht 65.0 in | Wt 169.6 lb

## 2017-12-01 DIAGNOSIS — E559 Vitamin D deficiency, unspecified: Secondary | ICD-10-CM

## 2017-12-01 DIAGNOSIS — M2550 Pain in unspecified joint: Secondary | ICD-10-CM

## 2017-12-01 MED ORDER — PREDNISONE 20 MG PO TABS: ORAL_TABLET | ORAL | 0 refills | Status: DC

## 2017-12-01 NOTE — Progress Notes (Signed)
   Subjective:    Patient ID: Amanda Woodard, female    DOB: 03-Sep-1966, 52 y.o.   MRN: [id number]  HPI Patient arrives with pain in multiple joints for 5 days. Significant joint pains and discomfort in her ankles knees fingers wrists this been present on past 5-6 days She had a similar issue several years back had lab work was negative and it went away with prednisone She denies any activity or injury that caused this  She did have a shoulder injury and the ankle injury on her job but this is been seen by [employer]. and they have treated it and she is got better Stiff in hands and knees Also wrists Pain and discomfort 12 years ago joint pain   Review of Systems  Constitutional: Negative for activity change and appetite change.  HENT: Negative for congestion.   Respiratory: Negative for cough.   Cardiovascular: Negative for chest pain.  Gastrointestinal: Negative for abdominal pain and vomiting.  Musculoskeletal: Positive for arthralgias and myalgias. Negative for gait problem.  Skin: Negative for color change.  Neurological: Negative for weakness.  Psychiatric/Behavioral: Negative for confusion.       Objective:   Physical Exam  Constitutional: She appears well-nourished. No distress.  HENT:  Head: Normocephalic.  Right Ear: External ear normal.  Left Ear: External ear normal.  Eyes: Right eye exhibits no discharge. Left eye exhibits no discharge.  Neck: No tracheal deviation present.  Cardiovascular: Normal rate, regular rhythm and normal heart sounds.  No murmur heard. Pulmonary/Chest: Effort normal and breath sounds normal. No respiratory distress. She has no wheezes. She has no rales.  Musculoskeletal: She exhibits no edema.  Lymphadenopathy:    She has no cervical adenopathy.  Neurological: She is alert.  Psychiatric: Her behavior is normal.  Vitals reviewed.         Assessment & Plan:  Arthralgias No rash seen No swelling noted Lab work ordered In  addition to this prednisone taper May need rheumatology consultation if progressive troubles or worse

## 2017-12-02 LAB — CBC WITH DIFFERENTIAL/PLATELET
BASOS ABS: 0 10*3/uL (ref 0.0–0.2)
Basos: 0 %
EOS (ABSOLUTE): 0.1 10*3/uL (ref 0.0–0.4)
Eos: 2 %
HEMOGLOBIN: 13.5 g/dL (ref 11.1–15.9)
Hematocrit: 42.3 % (ref 34.0–46.6)
Immature Grans (Abs): 0 10*3/uL (ref 0.0–0.1)
Immature Granulocytes: 0 %
LYMPHS ABS: 1.1 10*3/uL (ref 0.7–3.1)
Lymphs: 16 %
MCH: 30.5 pg (ref 26.6–33.0)
MCHC: 31.9 g/dL (ref 31.5–35.7)
MCV: 96 fL (ref 79–97)
Monocytes Absolute: 0.6 10*3/uL (ref 0.1–0.9)
Monocytes: 9 %
NEUTROS ABS: 5 10*3/uL (ref 1.4–7.0)
Neutrophils: 73 %
PLATELETS: 305 10*3/uL (ref 150–379)
RBC: 4.43 x10E6/uL (ref 3.77–5.28)
RDW: 13.6 % (ref 12.3–15.4)
WBC: 6.8 10*3/uL (ref 3.4–10.8)

## 2017-12-02 LAB — ANA: Anti Nuclear Antibody(ANA): NEGATIVE

## 2017-12-02 LAB — SEDIMENTATION RATE: Sed Rate: 2 mm/hr (ref 0–32)

## 2017-12-02 LAB — RHEUMATOID FACTOR: Rhuematoid fact SerPl-aCnc: <10 IU/mL (ref 0.0–13.9)

## 2017-12-02 LAB — VITAMIN D 25 HYDROXY (VIT D DEFICIENCY, FRACTURES): VIT D 25 HYDROXY: 22.9 ng/mL — AB (ref 30.0–100.0)

## 2017-12-04 ENCOUNTER — Encounter: Payer: Self-pay | Admitting: Family Medicine

## 2018-04-05 DIAGNOSIS — B078 Other viral warts: Secondary | ICD-10-CM | POA: Diagnosis not present

## 2018-04-05 DIAGNOSIS — L578 Other skin changes due to chronic exposure to nonionizing radiation: Secondary | ICD-10-CM | POA: Diagnosis not present

## 2018-04-27 ENCOUNTER — Encounter: Payer: Self-pay | Admitting: Family Medicine

## 2018-04-27 ENCOUNTER — Ambulatory Visit (INDEPENDENT_AMBULATORY_CARE_PROVIDER_SITE_OTHER): Payer: 59 | Admitting: Family Medicine

## 2018-04-27 VITALS — BP 130/88 | Temp 98.4°F | Ht 65.0 in | Wt 170.0 lb

## 2018-04-27 DIAGNOSIS — J019 Acute sinusitis, unspecified: Secondary | ICD-10-CM | POA: Diagnosis not present

## 2018-04-27 MED ORDER — CEFDINIR 300 MG PO CAPS: 300.0000 mg | ORAL_CAPSULE | Freq: Two times a day (BID) | ORAL | 0 refills | Status: DC

## 2018-04-27 NOTE — Progress Notes (Signed)
   Subjective:    Patient ID: Amanda Woodard, female    DOB: [birth year], 52 y.o.   MRN: [id number]  HPI  Patient is here today with complaints of a cough,congestion, right ear pain, sinus pressure, runny nose,fever  for the last two days.She has taken tylenol sinus and cold and a 24 hour allergy pill.  Stated with this the last sveral days  Due to fly son with     No reent hx    possile low grade fever     Felt achey   Forehead and dcheeks andough  Review of Systems     Objective:   Physical Exam  Alert, mild malaise. Hydration good Vitals stable. frontal/ maxillary tenderness evident positive nasal congestion. pharynx normal neck supple  lungs clear/no crackles or wheezes. heart regular in rhythm       Assessment & Plan:  Impression rhinosinusitis likely post viral, discussed with patient. plan antibiotics prescribed. Questions answered. Symptomatic care discussed. warning signs discussed. WSL

## 2018-05-04 ENCOUNTER — Telehealth: Payer: Self-pay | Admitting: Family Medicine

## 2018-05-04 NOTE — Telephone Encounter (Signed)
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 This is good solid antibiotic would take all ten days call us if persists after that and wants another round. Can do ENT but I think premature to consider a surgical tube

## 2018-05-04 NOTE — Telephone Encounter (Signed)
Please advise 

## 2018-05-04 NOTE — Telephone Encounter (Signed)
Patient is aware 

## 2018-05-04 NOTE — Telephone Encounter (Signed)
Patient seen for sinuses on 04-27-18. Flew out that day but hasn't flown again since the 13th.  Has been taking her antibiotic but her right ear has felt full.  Was really acting up while flying. Pressure coming and going.  She isn't due to fly again until  27th.  Patient needing advice. Keep on antibiotic and see if ear clears up? Other meds? Recheck next week? Or referral to ENT?

## 2018-05-11 ENCOUNTER — Other Ambulatory Visit: Payer: Self-pay | Admitting: Family Medicine

## 2018-05-11 ENCOUNTER — Telehealth: Payer: Self-pay | Admitting: Family Medicine

## 2018-05-11 MED ORDER — AMOXICILLIN-POT CLAVULANATE 875-125 MG PO TABS: ORAL_TABLET | ORAL | 0 refills | Status: DC

## 2018-05-11 NOTE — Telephone Encounter (Signed)
Spoke with patient. Pt states she has just finished the ABT Dr.Steve put her on, she has been taking allergy pills and using Flonase. Pt states that she would call first thing Monday morning if her ear was not better to be seen. Medication sent to pharmacy. Pt states that we could hold off on the referral.

## 2018-05-11 NOTE — Telephone Encounter (Signed)
More than likely has blocked eustachian tube with possible ongoing infection, typically we will do another round of antibiotics along with allergy medicine to try to help  It would be reasonable to do Augmentin 875 mg 1 twice daily for 10 days Also may take OTC generic Allegra 180 mg-known as fexofenadine 1 daily Also may use Flonase 2 sprays each nostril daily Finally may use nasal decongestant spray before flying to lessen the chance of the ear being stopped up Dr. Richardson Landry can recheck her on Monday if she is interested  We can set up referral to see ear nose throat specialist but typically takes a little while to get it if she is interested please initiate referral

## 2018-05-11 NOTE — Telephone Encounter (Signed)
Pt is needing some advice on her right ear. It is still complete blocked and very painful when she is flying. She just finished the antibiotic yesterday and she has to fly to [place] on Tuesday and she is unsure if she needs to come back for a recheck, or sent to a specialist regarding her ears. CB# [phone].

## 2018-08-03 ENCOUNTER — Telehealth: Payer: Self-pay | Admitting: Family Medicine

## 2018-08-03 MED ORDER — FLUCONAZOLE 150 MG PO TABS: ORAL_TABLET | ORAL | 0 refills | Status: DC

## 2018-08-03 NOTE — Telephone Encounter (Signed)
fluconazole (DIFLUCAN) 150 MG tablet  0 ordered        Summary: One tablet PO 3 days apart, Normal     Prescription sent electronically to pharmacy per protocol.  Patient notified.

## 2018-08-03 NOTE — Telephone Encounter (Signed)
Pt is hoping to get fluconazole (DIFLUCAN) 150 MG tablet called in to [place], [place]. Pt has been having burning itching frequent urination and discharge since yesterday.

## 2018-08-07 DIAGNOSIS — Z1231 Encounter for screening mammogram for malignant neoplasm of breast: Secondary | ICD-10-CM | POA: Diagnosis not present

## 2018-08-08 ENCOUNTER — Encounter: Payer: 59 | Admitting: Family Medicine

## 2018-08-14 ENCOUNTER — Ambulatory Visit (INDEPENDENT_AMBULATORY_CARE_PROVIDER_SITE_OTHER): Payer: 59 | Admitting: Family Medicine

## 2018-08-14 ENCOUNTER — Encounter: Payer: Self-pay | Admitting: Family Medicine

## 2018-08-14 VITALS — BP 124/82 | Ht 65.0 in | Wt 172.8 lb

## 2018-08-14 DIAGNOSIS — Z23 Encounter for immunization: Secondary | ICD-10-CM

## 2018-08-14 DIAGNOSIS — Z1211 Encounter for screening for malignant neoplasm of colon: Secondary | ICD-10-CM | POA: Diagnosis not present

## 2018-08-14 DIAGNOSIS — Z0000 Encounter for general adult medical examination without abnormal findings: Secondary | ICD-10-CM

## 2018-08-14 MED ORDER — ALPRAZOLAM 0.5 MG PO TABS: 0.5000 mg | ORAL_TABLET | Freq: Every evening | ORAL | 2 refills | Status: DC | PRN

## 2018-08-14 NOTE — Progress Notes (Signed)
Subjective:    Patient ID: Amanda Woodard, female    DOB: 04-28-1966, 52 y.o.   MRN: [id number]  HPI  The patient comes in today for a wellness visit.  A review of their health history was completed. A review of medications was also completed.  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 Rx by [place] in 2017, and then by her GYN in 2018.   Eating habits: eating healthy  Falls/  MVA accidents in past few months: none  Regular exercise: yes, tries to get 8-10,000 steps in per day, has a walking buddy  Specialist pt sees on regular basis: GYN in [place] last visit was last year. Pap smear done.   Preventative health issues were discussed.   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 Reports feeling tired and having irritation to scalp and neck, possibly from uniform. 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 States reported to employer, who was going to send her to a dermatologist. Feels like symptoms have resolved when not wearing uniform. Pt requesting heavy metal testing done. Reports she is in the process of switching uniforms.   Blood work done through work (glucose and lipid panel), will work on getting [place] those results for review.  Tetanus was in the last 5 years per patient.   LMP: on it currently, IUD for birth control with irregular bleeding. Sexually active, 1 partner, declines STD screening.  Review of Systems  Constitutional: Negative for chills, fatigue, fever and unexpected weight change.  HENT: Negative for congestion, ear pain, sinus pressure, sinus pain and sore throat.   Eyes: Negative for  discharge and visual disturbance.  Respiratory: Negative for cough, shortness of breath and wheezing.   Cardiovascular: Negative for chest pain and leg swelling.  Gastrointestinal: Negative for abdominal pain, blood in stool, constipation, diarrhea, nausea and vomiting.  Genitourinary: Negative for difficulty urinating, hematuria, menstrual problem, pelvic pain, vaginal discharge and vaginal pain.  Neurological: Negative for dizziness, weakness, light-headedness and headaches.  Psychiatric/Behavioral: Negative for suicidal ideas.  All other systems reviewed and are negative.      Objective:   Physical Exam  Constitutional: She is oriented to person, place, and time. She appears well-developed and well-nourished. No distress.  HENT:  Head: Normocephalic and atraumatic.  Right Ear: Tympanic membrane normal.  Left Ear: Tympanic membrane normal.  Nose: Nose normal.  Mouth/Throat: Uvula is midline and oropharynx is clear and moist.  Eyes: Pupils are equal, round, and reactive to light. Conjunctivae and EOM are normal. Right eye exhibits no discharge. Left eye exhibits no discharge.  Neck: Neck supple. No thyromegaly present.  Cardiovascular: Normal rate, regular rhythm and normal heart sounds.  No murmur heard. Pulmonary/Chest: Effort normal and breath sounds normal. No respiratory distress. She has no wheezes.  Defers breast exam,  denies problems.  Abdominal: Soft. Bowel sounds are normal. She exhibits no distension and no mass. There is no tenderness.  Genitourinary:  Genitourinary Comments: Defers GU exam, denies problems.  Musculoskeletal: She exhibits no edema or deformity.  Lymphadenopathy:    She has no cervical adenopathy.  Neurological: She is alert and oriented to person, place, and time. Coordination normal.  Skin: Skin is warm and dry.  Psychiatric: She has a normal mood and affect.  Nursing note and vitals reviewed.         Assessment & Plan:  1. Well adult  exam Adult wellness-complete.wellness physical was conducted today. Importance of diet and exercise were discussed in detail.  In addition to this a discussion regarding safety was also covered. We also reviewed over immunizations and gave recommendations regarding current immunization needed for age.   -Flu shot today In addition to this additional areas were also touched on including: Preventative health exams needed:  Colonoscopy: referral placed Mammogram: UTD per patient Pap Smear: UTD per patient  Patient was advised yearly wellness exam.  She will send [place] a copy of her most recent lab work that was done through her employer.  Pt requesting refill on xanax rx. States she uses rarely for sleep when travelling internationally. PMP database checked. Will send in to preferred pharmacy.  2. Encounter for screening colonoscopy - Plan: Ambulatory referral to Gastroenterology  3. Need for vaccination - Plan: Flu Vaccine QUAD 36+ mos IM   Pt requesting testing for heavy metals. Will have to do additional research on this before ordering lab work and will notify her of this.  Dr. Mickie Hillier was consulted on this case and is in agreement with the above treatment plan.

## 2018-08-22 ENCOUNTER — Encounter (INDEPENDENT_AMBULATORY_CARE_PROVIDER_SITE_OTHER): Payer: Self-pay | Admitting: *Deleted

## 2018-08-22 ENCOUNTER — Encounter: Payer: Self-pay | Admitting: Family Medicine

## 2018-08-29 ENCOUNTER — Other Ambulatory Visit: Payer: Self-pay | Admitting: Family Medicine

## 2018-08-29 NOTE — Telephone Encounter (Signed)
Left message to return call 

## 2018-09-06 ENCOUNTER — Telehealth: Payer: Self-pay | Admitting: Family Medicine

## 2018-09-06 DIAGNOSIS — R5383 Other fatigue: Secondary | ICD-10-CM

## 2018-09-06 DIAGNOSIS — R21 Rash and other nonspecific skin eruption: Secondary | ICD-10-CM

## 2018-09-06 NOTE — Telephone Encounter (Signed)
It is listed as heavy metal panel in Epic

## 2018-09-06 NOTE — Telephone Encounter (Signed)
Please order this for the patient and let her know to get labs drawn. Thank you!

## 2018-09-06 NOTE — Addendum Note (Signed)
Addended by: Dairl Ponder on: 09/06/2018 01:54 PM   Modules accepted: Orders

## 2018-09-06 NOTE — Telephone Encounter (Signed)
Pt was requesting heavy metal testing done at her last visit. Nurses please look and see if there's a heavy metal testing screening via [facility] or if I will need to order those individually. Thank you!

## 2018-09-06 NOTE — Telephone Encounter (Signed)
Blood work ordered in Epic. Patient notified. 

## 2018-10-26 ENCOUNTER — Telehealth: Payer: Self-pay | Admitting: Family Medicine

## 2018-10-26 DIAGNOSIS — R21 Rash and other nonspecific skin eruption: Secondary | ICD-10-CM | POA: Diagnosis not present

## 2018-10-26 DIAGNOSIS — R5383 Other fatigue: Secondary | ICD-10-CM | POA: Diagnosis not present

## 2018-10-26 NOTE — Telephone Encounter (Signed)
Left message to return call 

## 2018-10-26 NOTE — Telephone Encounter (Signed)
Can we add lab orders for pt?  We ordered a check for arsenic, lead, & mercury (due to pt's exposure to toxic uniform at work)  Can we add nickel, antimony, chromium, mercury, formaldehyde, bromine, & fluorine? Co-workers that have been affected have tested positive for the requested additions.  Pt had labs drawn today & if OK these tests can be added to blood already drawn if we will put orders in Epic & call [facility] so they can add to existing lab draw & blood will be sent out later today.  Please advise & call pt when done

## 2018-10-26 NOTE — Telephone Encounter (Signed)
Please let patient know that I discussed this with Dr. Richardson Landry. We do not recommend additional metal testing as many of [place] are sensitive to trace metals. If she would like to pursue further testing I recommend she see dermatology for further discussion of this. We can place referral for patient if she would like. Thanks

## 2018-10-26 NOTE — Telephone Encounter (Signed)
Patient advised Amanda Woodard discussed this with Dr. Richardson Landry. We do not recommend additional metal testing as many of [place] are sensitive to trace metals. If she would like to pursue further testing Amanda Woodard  recommends she see dermatology for further discussion of this. Patient states she will discuss this with her dermatologist for additional lab work.

## 2018-10-29 LAB — HEAVY METALS, BLOOD
Arsenic: 6 ug/L (ref 2–23)
Lead, Blood: 1 ug/dL (ref 0–4)
Mercury: 2.8 ug/L (ref 0.0–14.9)

## 2018-11-07 ENCOUNTER — Encounter: Payer: Self-pay | Admitting: Family Medicine

## 2018-11-22 ENCOUNTER — Encounter: Payer: Self-pay | Admitting: Internal Medicine

## 2018-11-22 ENCOUNTER — Telehealth: Payer: Self-pay | Admitting: Internal Medicine

## 2018-11-22 ENCOUNTER — Ambulatory Visit: Payer: 59

## 2018-11-22 NOTE — Telephone Encounter (Signed)
noted 

## 2018-11-22 NOTE — Telephone Encounter (Signed)
PATIENT WAS A NO SHOW AND LETTER SENT  °

## 2018-11-26 ENCOUNTER — Ambulatory Visit (INDEPENDENT_AMBULATORY_CARE_PROVIDER_SITE_OTHER): Payer: 59 | Admitting: *Deleted

## 2018-11-26 DIAGNOSIS — Z1211 Encounter for screening for malignant neoplasm of colon: Secondary | ICD-10-CM

## 2018-11-26 MED ORDER — NA SULFATE-K SULFATE-MG SULF 17.5-3.13-1.6 GM/177ML PO SOLN: 1.0000 | ORAL | 0 refills | Status: DC

## 2018-11-26 NOTE — Progress Notes (Addendum)
Gastroenterology Pre-Procedure Review  Request Date: 11/26/2018 Requesting Physician: Dr. Wolfgang Phoenix (no previous tcs)  PATIENT REVIEW QUESTIONS: The patient responded to the following health history questions as indicated:    1. Diabetes Melitis: no 2. Joint replacements in the past 12 months: no 3. Major health problems in the past 3 months: no 4. Has an artificial valve or MVP: no 5. Has a defibrillator: no 6. Has been advised in past to take antibiotics in advance of a procedure like teeth cleaning: no 7. Family history of colon cancer: no  8. Alcohol Use: yes (4 glasses a week/1 glass of wine a day) 9. History of sleep apnea: no  10. History of coronary artery or other vascular stents placed within the last 12 months: no 11. History of any prior anesthesia complications: no    MEDICATIONS & ALLERGIES:    Patient reports the following regarding taking any blood thinners:   Plavix? no Aspirin? no Coumadin? no Brilinta? no Xarelto? no Eliquis? no Pradaxa? no Savaysa? no Effient? no  Patient confirms/reports the following medications:  Current Outpatient Medications  Medication Sig Dispense Refill  . ALPRAZolam (XANAX) 0.5 MG tablet Take 1 tablet (0.5 mg total) by mouth at bedtime as needed for sleep. 30 tablet 2   No current facility-administered medications for this visit.     Patient confirms/reports the following allergies:  No Known Allergies  No orders of the defined types were placed in this encounter.   AUTHORIZATION INFORMATION Primary Insurance: [employer],  [place] #: [id number] Pre-Cert / Josem Kaufmann required: Yes Pre-Cert / Auth #: [id number]  SCHEDULE INFORMATION: Procedure has been scheduled as follows:  Date: 02/06/2019, Time: 7:30 Location: [facility]/Dr. Gala Romney  This Gastroenterology Pre-Precedure Review Form is being routed to the following provider(s): Neil Crouch, PA

## 2018-11-26 NOTE — Patient Instructions (Addendum)
Amanda Woodard  Jun 07, 1966 MRN: [id number]     Procedure Date: 03/27/2019 Time to register: 12:00 Place to register: [facility] Procedure Time: 1:00 Scheduled provider: Dr. Gala Romney    PREPARATION FOR COLONOSCOPY WITH SUPREP BOWEL PREP KIT  Note: Suprep Bowel Prep Kit is a split-dose (2day) regimen. Consumption of BOTH 6-ounce bottles is required for a complete prep.  Please notify us immediately if you are diabetic, take iron supplements, or if you are on Coumadin or any other blood thinners.                                                                                                                                                  2 DAYS BEFORE PROCEDURE:  DATE: 03/25/2019  DAY: Monday Begin clear liquid diet AFTER your lunch meal. NO SOLID FOODS after this point.  1 DAY BEFORE PROCEDURE:  DATE: 03/26/2019 DAY: Tuesday Continue clear liquids the entire day - NO SOLID FOOD.    At 6:00pm: Complete steps 1 through 4 below, using ONE (1) 6-ounce bottle, before going to bed. Step 1:  Pour ONE (1) 6-ounce bottle of SUPREP liquid into the mixing container.  Step 2:  Add cool drinking water to the 16 ounce line on the container and mix.  Note: Dilute the solution concentrate as directed prior to use. Step 3:  DRINK ALL the liquid in the container. Step 4:  You MUST drink an additional two (2) or more 16 ounce containers of water over the next one (1) hour.   Continue clear liquids.  DAY OF PROCEDURE:   DATE: 03/27/2019 DAY: Wednesday If you take medications for your heart, blood pressure, or breathing, you may take these medications.   5 hours before your procedure at 8:00: Step 1:  Pour ONE (1) 6-ounce bottle of SUPREP liquid into the mixing container.  Step 2:  Add cool drinking water to the 16 ounce line on the container and mix.  Note: Dilute the solution concentrate as directed prior to use. Step 3:  DRINK ALL the liquid in the container. Step 4:  You MUST drink an  additional two (2) or more 16 ounce containers of water over the next one (1) hour. You MUST complete the final glass of water at least 3 hours before your colonoscopy.  Nothing past 10:00.    You may take your morning medications with sip of water unless we have instructed otherwise.    Please see below for Dietary Information.  CLEAR LIQUIDS INCLUDE:  Water Jello (NOT red in color)   Ice Popsicles (NOT red in color)   Tea (sugar ok, no milk/cream) Powdered fruit flavored drinks  Coffee (sugar ok, no milk/cream) Gatorade/ Lemonade/ Kool-Aid  (NOT red in color)   Juice: apple, white grape, white cranberry Soft drinks  Clear bullion, consomme, broth (fat free beef/chicken/vegetable)  Carbonated beverages (any kind)  Strained chicken noodle soup Hard Candy   Remember: Clear liquids are liquids that will allow you to see your fingers on the other side of a clear glass. Be sure liquids are NOT red in color, and not cloudy, but CLEAR.  DO NOT EAT OR DRINK ANY OF THE FOLLOWING:  Dairy products of any kind   Cranberry juice Tomato juice / V8 juice   Grapefruit juice Orange juice     Red grape juice  Do not eat any solid foods, including such foods as: cereal, oatmeal, yogurt, fruits, vegetables, creamed soups, eggs, bread, crackers, pureed foods in a blender, etc.   HELPFUL HINTS FOR DRINKING PREP SOLUTION:   Make sure prep is extremely cold. Mix and refrigerate the the morning of the prep. You may also put in the freezer.   You may try mixing some Crystal Light or Country Time Lemonade if you prefer. Mix in small amounts; add more if necessary.  Try drinking through a straw  Rinse mouth with water or a mouthwash between glasses, to remove after-taste.  Try sipping on a cold beverage /ice/ popsicles between glasses of prep.  Place a piece of sugar-free hard candy in mouth between glasses.  If you become nauseated, try consuming smaller amounts, or stretch out the time between  glasses. Stop for 30-60 minutes, then slowly start back drinking.     OTHER INSTRUCTIONS  You will need a responsible adult at least 51 years of age to accompany you and drive you home. This person must remain in the waiting room during your procedure. The hospital will cancel your procedure if you do not have a responsible adult with you.   1. Wear loose fitting clothing that is easily removed. 2. Leave jewelry and other valuables at home.  3. Remove all body piercing jewelry and leave at home. 4. Total time from sign-in until discharge is approximately 2-3 hours. 5. You should go home directly after your procedure and rest. You can resume normal activities the day after your procedure. 6. The day of your procedure you should not:  Drive  Make legal decisions  Operate machinery  Drink alcohol  Return to work   You may call the office (Dept: [phone]) before 5:00pm, or page the doctor on call [phone]) after 5:00pm, for further instructions, if necessary.   Insurance Information YOU WILL NEED TO CHECK WITH YOUR INSURANCE COMPANY FOR THE BENEFITS OF COVERAGE YOU HAVE FOR THIS PROCEDURE.  UNFORTUNATELY, NOT ALL INSURANCE COMPANIES HAVE BENEFITS TO COVER ALL OR PART OF THESE TYPES OF PROCEDURES.  IT IS YOUR RESPONSIBILITY TO CHECK YOUR BENEFITS, HOWEVER, WE WILL BE GLAD TO ASSIST YOU WITH ANY CODES YOUR INSURANCE COMPANY MAY NEED.    PLEASE NOTE THAT MOST INSURANCE COMPANIES WILL NOT COVER A SCREENING COLONOSCOPY FOR PEOPLE UNDER THE AGE OF 50  IF YOU HAVE [employer] INSURANCE, YOU MAY HAVE BENEFITS FOR A SCREENING COLONOSCOPY BUT IF POLYPS ARE FOUND THE DIAGNOSIS WILL CHANGE AND THEN YOU MAY HAVE A DEDUCTIBLE THAT WILL NEED TO BE MET. SO PLEASE MAKE SURE YOU CHECK YOUR BENEFITS FOR A SCREENING COLONOSCOPY AS WELL AS A DIAGNOSTIC COLONOSCOPY.

## 2018-11-26 NOTE — Progress Notes (Signed)
Limited Xanax use, per substance control database she has received xanax #30 in the past one year. No other controlled substances reported.   OK to schedule.

## 2018-11-27 ENCOUNTER — Other Ambulatory Visit: Payer: Self-pay

## 2018-11-27 ENCOUNTER — Ambulatory Visit (INDEPENDENT_AMBULATORY_CARE_PROVIDER_SITE_OTHER): Payer: 59 | Admitting: Family Medicine

## 2018-11-27 ENCOUNTER — Telehealth: Payer: Self-pay | Admitting: Internal Medicine

## 2018-11-27 ENCOUNTER — Encounter: Payer: Self-pay | Admitting: Family Medicine

## 2018-11-27 VITALS — BP 121/77 | HR 88 | Ht 65.0 in | Wt 177.0 lb

## 2018-11-27 DIAGNOSIS — R102 Pelvic and perineal pain: Secondary | ICD-10-CM

## 2018-11-27 NOTE — Addendum Note (Signed)
Addended by: Metro Kung on: 11/27/2018 10:57 AM   Modules accepted: Orders, SmartSet

## 2018-11-27 NOTE — Patient Instructions (Signed)
Ovarian Cyst  An ovarian cyst is a fluid-filled sac on an ovary. The ovaries are organs that make eggs in women. Most ovarian cysts go away on their own and are not cancerous (are benign). Some cysts need treatment.  Follow these instructions at home:   Take over-the-counter and prescription medicines only as told by your doctor.   Do not drive or use heavy machinery while taking prescription pain medicine.   Get pelvic exams and Pap tests as often as told by your doctor.   Return to your normal activities as told by your doctor. Ask your doctor what activities are safe for you.   Do not use any products that contain nicotine or tobacco, such as cigarettes and e-cigarettes. If you need help quitting, ask your doctor.   Keep all follow-up visits as told by your doctor. This is important.  Contact a doctor if:   Your periods are:  ? Late.  ? Irregular.  ? Painful.     Your periods stop.   You have pelvic pain that does not go away.   You have pressure on your bladder.   You have trouble making your bladder empty when you pee (urinate).   You have pain during sex.   You have any of the following in your belly (abdomen):  ? A feeling of fullness.  ? Pressure.  ? Discomfort.  ? Pain that does not go away.  ? Swelling.   You feel sick most of the time.   You have trouble pooping (have constipation).   You are not as hungry as usual (you lose your appetite).   You get very bad acne.   You start to have more hair on your body and face.   You are gaining weight or losing weight without changing your exercise and eating habits.   You think you may be pregnant.  Get help right away if:   You have belly pain that is very bad or gets worse.   You cannot eat or drink without throwing up (vomiting).   You suddenly get a fever.   Your period is a lot heavier than usual.  This information is not intended to replace advice given to you by your health care provider. Make sure you discuss any questions you have  with your health care provider.  Document Released: 02/22/2008 Document Revised: 03/25/2016 Document Reviewed: 02/07/2016  Elsevier Interactive Patient Education  2019 [employer]

## 2018-11-27 NOTE — Telephone Encounter (Signed)
Called pt and pt requested savings voucher to be sent via e-mail.  Sent as per requested.

## 2018-11-27 NOTE — Telephone Encounter (Signed)
Pt seen yesterday and was asking if we have a discount card for her prep. Please advise and call her at [phone]

## 2018-11-27 NOTE — Telephone Encounter (Signed)
Routing to Angie 

## 2018-11-27 NOTE — Progress Notes (Signed)
    GYNECOLOGY ANNUAL PREVENTATIVE CARE ENCOUNTER NOTE  Subjective:   Amanda Woodard is a 53 y.o. G2P2 female here for a routine annual gynecologic exam.  Current complaints: RLQ pain.   Denies abnormal vaginal bleeding, discharge, pelvic pain, problems with intercourse or other gynecologic concerns.   Right lower abdominal pain. Feels with sitting or driving. Sharp in nature. reports prior ovarian cyst on right that went away.  Last [place] was in [place] several years ago/unknown date.   Reports periods are irregular, has LNG-IUD placed in 2017  LMP 11/18/2018 Mother went through menopause in her mid to early 11s  Reports hot flashes, worse at night.  Reports irritability. No problems sleeping   Has colonoscopy scheduled in May.   Gynecologic History Patient's last menstrual period was 11/18/2018. Contraception: IUD Last Pap: 2018. Results were: normal Last mammogram: 07/2018. Results were: normal  Health Maintenance Due  Topic Date Due  . HIV Screening  05/04/1983  . TETANUS/TDAP  05/04/1987  . COLONOSCOPY  05/03/2018    The following portions of the patient's history were reviewed and updated as appropriate: allergies, current medications, past family history, past medical history, past social history, past surgical history and problem list.  Review of Systems Pertinent items are noted in HPI.   Objective:  BP 121/77 (BP Location: Right Arm, Patient Position: Sitting, Cuff Size: Normal)   Pulse 88   Ht 5\' 5"  (1.651 m)   Wt 177 lb (80.3 kg)   LMP 11/18/2018   BMI 29.45 kg/m   Physical Exam  Constitutional: She is oriented to person, place, and time. She appears well-developed and well-nourished.  HENT:  Head: Normocephalic and atraumatic.  Eyes: Conjunctivae are normal.  Neck: Normal range of motion. Neck supple.  Cardiovascular: Normal rate and intact distal pulses.  Pulmonary/Chest: Effort normal.  Abdominal: Soft. There is no abdominal tenderness.  Genitourinary:     Vagina and uterus normal.  Cervix exhibits no motion tenderness. Right adnexum displays tenderness and fullness. Right adnexum displays no mass. Left adnexum displays tenderness. Left adnexum displays no mass and no fullness.     Musculoskeletal: Normal range of motion.  Neurological: She is alert and oriented to person, place, and time.  Skin: Skin is warm and dry. No erythema.  Psychiatric: She has a normal mood and affect.  Nursing note and vitals reviewed.     Assessment and Plan:   1. Right adnexal tenderness Possible ovarian cyst, not large on exam but will get [place] to confirm and help management  - Recommended NSAIDs - US PELVIS (TRANSABDOMINAL ONLY); Future  2. Perimenopause- discussed leaving IUD in place until 2021/2022 and then removal to see if she had gone through menopause.   Please refer to After Visit Summary for other counseling recommendations.   Return in about 1 year (around 11/27/2019), or if symptoms worsen or fail to improve-Pelvic pain, for Yearly wellness exam.  Caren Macadam, MD, MPH, ABFM Attending [place] for [facility]

## 2018-12-03 ENCOUNTER — Other Ambulatory Visit: Payer: Self-pay | Admitting: Family Medicine

## 2018-12-03 DIAGNOSIS — R102 Pelvic and perineal pain: Secondary | ICD-10-CM

## 2018-12-04 ENCOUNTER — Other Ambulatory Visit: Payer: Self-pay

## 2018-12-04 ENCOUNTER — Ambulatory Visit (INDEPENDENT_AMBULATORY_CARE_PROVIDER_SITE_OTHER): Payer: 59

## 2018-12-04 DIAGNOSIS — R102 Pelvic and perineal pain: Secondary | ICD-10-CM | POA: Diagnosis not present

## 2018-12-04 NOTE — Progress Notes (Signed)
PELVIC US TA/TV:Homogeneous anteverted uterus,wnl,IUD is centrally located with in the endometrium,EEC 5 mm,mult simple nabothian cysts,normal ovaries bilat,no free fluid,ovaries appear mobile,no pain during ultrasound

## 2018-12-17 ENCOUNTER — Telehealth: Payer: Self-pay | Admitting: Family Medicine

## 2018-12-17 NOTE — Telephone Encounter (Signed)
Reviewed [place]. IUD is in place and no ovarian cysts seen on [place]. Uterus appeared normal and no noted abnormalities on the ultrasound. If she is in a lot of pain I recommend a repeat visit or evaluation by PCP. This is unlikely uterine or ovarian related pain based on the [place].  She can try ibuprofen 600-800mg  every 6 hours for 3 days if needed as long as she does not have a fever on ROS. If she has a fever she needs evaluation as soon as possible.

## 2018-12-17 NOTE — Telephone Encounter (Signed)
Patient called, would like ultrasound results, she had it done last week.  She's in a lot of pain.  Amanda Woodard  [phone]

## 2019-01-16 NOTE — Progress Notes (Signed)
Pt called and requested to re-schedule her procedure due to COVID 19.  Pt re-scheduled to 03/27/2019.  Pt aware that we are mailing out new instructions. Endo notified.

## 2019-03-05 ENCOUNTER — Telehealth: Payer: Self-pay | Admitting: Internal Medicine

## 2019-03-05 NOTE — Telephone Encounter (Signed)
Pt wants to cancel her colonoscopy with RMR on 7/8. She is going to have a covid test in August when she goes back to work and doesn't want to have it done twice. She will reschedule later.

## 2019-03-05 NOTE — Telephone Encounter (Signed)
Lmom to see if pt would like to go ahead and re-schedule her procedure to Aug/Sept.

## 2019-03-07 NOTE — Telephone Encounter (Addendum)
Called pt again to see if we could get her procedure re-scheduled to Aug.  Had to leave a voice mail.  Notified Carolyn in Endo.  She will leave her orders in the depot for now.

## 2019-03-13 ENCOUNTER — Other Ambulatory Visit: Payer: Self-pay | Admitting: Internal Medicine

## 2019-03-13 ENCOUNTER — Other Ambulatory Visit: Payer: Self-pay

## 2019-03-13 DIAGNOSIS — Z20822 Contact with and (suspected) exposure to covid-19: Secondary | ICD-10-CM

## 2019-03-18 LAB — NOVEL CORONAVIRUS, NAA: SARS-CoV-2, NAA: NOT DETECTED

## 2019-03-27 ENCOUNTER — Encounter (HOSPITAL_COMMUNITY): Admission: RE | Payer: Self-pay | Source: Home / Self Care

## 2019-03-27 ENCOUNTER — Ambulatory Visit (HOSPITAL_COMMUNITY): Admission: RE | Admit: 2019-03-27 | Payer: 59 | Source: Home / Self Care | Admitting: Internal Medicine

## 2019-03-27 SURGERY — COLONOSCOPY
Anesthesia: Moderate Sedation

## 2019-05-31 ENCOUNTER — Ambulatory Visit (INDEPENDENT_AMBULATORY_CARE_PROVIDER_SITE_OTHER): Payer: 59 | Admitting: Nurse Practitioner

## 2019-05-31 ENCOUNTER — Ambulatory Visit (HOSPITAL_COMMUNITY)
Admission: RE | Admit: 2019-05-31 | Discharge: 2019-05-31 | Disposition: A | Discharge status: Home / Self Care | Payer: 59 | Source: Ambulatory Visit | Attending: Nurse Practitioner | Admitting: Nurse Practitioner

## 2019-05-31 ENCOUNTER — Other Ambulatory Visit: Payer: Self-pay

## 2019-05-31 ENCOUNTER — Encounter (HOSPITAL_COMMUNITY): Payer: Self-pay

## 2019-05-31 ENCOUNTER — Encounter: Payer: Self-pay | Admitting: Nurse Practitioner

## 2019-05-31 VITALS — BP 110/76 | Temp 97.6°F | Ht 65.0 in | Wt 176.0 lb

## 2019-05-31 DIAGNOSIS — R222 Localized swelling, mass and lump, trunk: Secondary | ICD-10-CM

## 2019-05-31 DIAGNOSIS — M5442 Lumbago with sciatica, left side: Secondary | ICD-10-CM

## 2019-05-31 DIAGNOSIS — R0789 Other chest pain: Secondary | ICD-10-CM

## 2019-05-31 MED ORDER — DICLOFENAC SODIUM 75 MG PO TBEC: 75.0000 mg | DELAYED_RELEASE_TABLET | Freq: Two times a day (BID) | ORAL | 0 refills | Status: DC

## 2019-05-31 NOTE — Patient Instructions (Addendum)
Heat or ice Biofreeze Lidocaine patch Voltaren gel  Sciatica Rehab Ask your health care provider which exercises are safe for you. Do exercises exactly as told by your health care provider and adjust them as directed. It is normal to feel mild stretching, pulling, tightness, or discomfort as you do these exercises. Stop right away if you feel sudden pain or your pain gets worse. Do not begin these exercises until told by your health care provider. Stretching and range-of-motion exercises These exercises warm up your muscles and joints and improve the movement and flexibility of your hips and back. These exercises also help to relieve pain, numbness, and tingling. Sciatic nerve glide 1. Sit in a chair with your head facing down toward your chest. Place your hands behind your back. Let your shoulders slump forward. 2. Slowly straighten one of your legs while you tilt your head back as if you are looking toward the ceiling. Only straighten your leg as far as you can without making your symptoms worse. 3. Hold this position for __________ seconds. 4. Slowly return to the starting position. 5. Repeat with your other leg. Repeat __________ times. Complete this exercise __________ times a day. Knee to chest with hip adduction and internal rotation  1. Lie on your back on a firm surface with both legs straight. 2. Bend one of your knees and move it up toward your chest until you feel a gentle stretch in your lower back and buttock. Then, move your knee toward the shoulder that is on the opposite side from your leg. This is hip adduction and internal rotation. ? Hold your leg in this position by holding on to the front of your knee. 3. Hold this position for __________ seconds. 4. Slowly return to the starting position. 5. Repeat with your other leg. Repeat __________ times. Complete this exercise __________ times a day. Prone extension on elbows  1. Lie on your abdomen on a firm surface. A bed may  be too soft for this exercise. 2. Prop yourself up on your elbows. 3. Use your arms to help lift your chest up until you feel a gentle stretch in your abdomen and your lower back. ? This will place some of your body weight on your elbows. If this is uncomfortable, try stacking pillows under your chest. ? Your hips should stay down, against the surface that you are lying on. Keep your hip and back muscles relaxed. 4. Hold this position for __________ seconds. 5. Slowly relax your upper body and return to the starting position. Repeat __________ times. Complete this exercise __________ times a day. Strengthening exercises These exercises build strength and endurance in your back. Endurance is the ability to use your muscles for a long time, even after they get tired. Pelvic tilt This exercise strengthens the muscles that lie deep in the abdomen. 1. Lie on your back on a firm surface. Bend your knees and keep your feet flat on the floor. 2. Tense your abdominal muscles. Tip your pelvis up toward the ceiling and flatten your lower back into the floor. ? To help with this exercise, you may place a small towel under your lower back and try to push your back into the towel. 3. Hold this position for __________ seconds. 4. Let your muscles relax completely before you repeat this exercise. Repeat __________ times. Complete this exercise __________ times a day. Alternating arm and leg raises  1. Get on your hands and knees on a firm surface. If you are  on a hard floor, you may want to use padding, such as an exercise mat, to cushion your knees. 2. Line up your arms and legs. Your hands should be directly below your shoulders, and your knees should be directly below your hips. 3. Lift your left leg behind you. At the same time, raise your right arm and straighten it in front of you. ? Do not lift your leg higher than your hip. ? Do not lift your arm higher than your shoulder. ? Keep your abdominal and  back muscles tight. ? Keep your hips facing the ground. ? Do not arch your back. ? Keep your balance carefully, and do not hold your breath. 4. Hold this position for __________ seconds. 5. Slowly return to the starting position. 6. Repeat with your right leg and your left arm. Repeat __________ times. Complete this exercise __________ times a day. Posture and body mechanics Good posture and healthy body mechanics can help to relieve stress in your body's tissues and joints. Body mechanics refers to the movements and positions of your body while you do your daily activities. Posture is part of body mechanics. Good posture means:  Your spine is in its natural S-curve position (neutral).  Your shoulders are pulled back slightly.  Your head is not tipped forward. Follow these guidelines to improve your posture and body mechanics in your everyday activities. Standing   When standing, keep your spine neutral and your feet about hip width apart. Keep a slight bend in your knees. Your ears, shoulders, and hips should line up.  When you do a task in which you stand in one place for a long time, place one foot up on a stable object that is 2-4 inches (5-10 cm) high, such as a footstool. This helps keep your spine neutral. Sitting   When sitting, keep your spine neutral and keep your feet flat on the floor. Use a footrest, if necessary, and keep your thighs parallel to the floor. Avoid rounding your shoulders, and avoid tilting your head forward.  When working at a desk or a computer, keep your desk at a height where your hands are slightly lower than your elbows. Slide your chair under your desk so you are close enough to maintain good posture.  When working at a computer, place your monitor at a height where you are looking straight ahead and you do not have to tilt your head forward or downward to look at the screen. Resting  When lying down and resting, avoid positions that are most  painful for you.  If you have pain with activities such as sitting, bending, stooping, or squatting, lie in a position in which your body does not bend very much. For example, avoid curling up on your side with your arms and knees near your chest (fetal position).  If you have pain with activities such as standing for a long time or reaching with your arms, lie with your spine in a neutral position and bend your knees slightly. Try the following positions: ? Lying on your side with a pillow between your knees. ? Lying on your back with a pillow under your knees. Lifting   When lifting objects, keep your feet at least shoulder width apart and tighten your abdominal muscles.  Bend your knees and hips and keep your spine neutral. It is important to lift using the strength of your legs, not your back. Do not lock your knees straight out.  Always ask for help to  lift heavy or awkward objects. This information is not intended to replace advice given to you by your health care provider. Make sure you discuss any questions you have with your health care provider. Document Released: 09/05/2005 Document Revised: 12/28/2018 Document Reviewed: 09/27/2018 Elsevier Patient Education  2020 [employer].

## 2019-05-31 NOTE — Progress Notes (Signed)
   Subjective:    Patient ID: Amanda Woodard, female    DOB: Aug 02, 1966, 53 y.o.   MRN: [id number]  HPIswelling on right side of neck and a little soreness. Started one week ago.   Sciatica on left side. Comes and goes. Worse when sitting. Had some numbness in her leg today. Tried ibuprofen.   Husband has told her she is snoring.  Presents for c/o left sided low back pain over the past 2 months. At times, will radiate down her left buttock, lateral thigh into the anterior leg. Will occasionally radiate into the foot. No history of injury. Works as a Catering manager doing some international trips. Sitting more than she used to since service is limited due to COVID so less walking during flights. Worse with prolonged sitting. Better with walking or standing. Some relief with ice, Ibuprofen and lying flat with a pillow under her knees. No weakness in her leg.  Also mentions some swelling in the right supraclavicular area for about a week. Noticed a difference between the sides when looking in the mirror. Minimal tenderness. No neck pain. No difficulty swallowing. Has been told by her husband that she snores which a recent change for her.    Review of Systems     Objective:   Physical Exam NAD. Alert, oriented. Lungs clear. Heart RRR. Carotids no bruits or thrills. Thyroid nontender, no mass or goiter noted. Normal ROM of the neck without tenderness. Normal ROM right shoulder without tenderness. Soft, fluctuant area noted in the right supraclavicular fossa. Minimal tenderness near the clavicle. No mass or lymphadenopathy noted.  Tenderness in the left lumbar area to palpation. SLR neg bilat. Patellar and achilles reflexes normal bilat. Gait normal. Can get on and off table without difficulty. Normal ROM of the spine.        Assessment & Plan:  Acute left-sided low back pain with left-sided sciatica  Right-sided chest wall pain - Plan: DG Chest 2 View  Supraclavicular fossa fullness - Plan: DG  Chest 2 View  Meds ordered this encounter  Medications  . diclofenac (VOLTAREN) 75 MG EC tablet    Sig: Take 1 tablet (75 mg total) by mouth 2 (two) times daily. Prn pain    Dispense:  30 tablet    Refill:  0    Order Specific Question:   Supervising Provider    Answer:   Sallee Lange A [[id number]]  Discuss OTC options: Heat or ice Biofreeze Lidocaine patch Voltaren gel Given copy of low back exercises. Recommend chest xray. Encouraged patient to get this done this afternoon if possible. Further follow up or testing based on results. After visit, it was noted she had a wellness exam in November but no breast exam. Also no mammogram report is in [place]. A note was sent to nursing staff and patient to follow up on this.  Warning signs reviewed. Call back next week if no improvement, sooner if problems.   25 minutes was spent with the patient.  This statement verifies that 25 minutes was indeed spent with the patient.  More than 50% of this visit-total duration of the visit-was spent in counseling and coordination of care. The issues that the patient came in for today as reflected in the diagnosis (s) please refer to documentation for further details.

## 2019-06-01 ENCOUNTER — Encounter: Payer: Self-pay | Admitting: Nurse Practitioner

## 2019-06-07 ENCOUNTER — Ambulatory Visit: Payer: 59 | Admitting: Nurse Practitioner

## 2019-10-23 ENCOUNTER — Encounter: Payer: Self-pay | Admitting: Family Medicine

## 2019-11-11 ENCOUNTER — Encounter: Payer: Self-pay | Admitting: Internal Medicine

## 2019-11-11 NOTE — Telephone Encounter (Signed)
AS, pt is calling back to get scheduled for her TCS. Pt states she was able to previously have it before. Contact pt at. [phone].

## 2019-11-11 NOTE — Telephone Encounter (Signed)
SCHEDULED PATIENT FOR NURSE VISIT AND MAILED LETTER

## 2019-11-11 NOTE — Telephone Encounter (Signed)
Noted  

## 2019-11-11 NOTE — Telephone Encounter (Signed)
Pt will need nurse visit since it has been over 6 months.

## 2019-11-26 ENCOUNTER — Ambulatory Visit: Payer: 59 | Attending: Internal Medicine

## 2019-11-26 ENCOUNTER — Other Ambulatory Visit: Payer: Self-pay

## 2019-11-26 DIAGNOSIS — Z20822 Contact with and (suspected) exposure to covid-19: Secondary | ICD-10-CM

## 2019-11-27 LAB — NOVEL CORONAVIRUS, NAA: SARS-CoV-2, NAA: NOT DETECTED

## 2020-01-16 ENCOUNTER — Ambulatory Visit: Payer: 59

## 2020-02-13 ENCOUNTER — Telehealth: Payer: Self-pay | Admitting: Family Medicine

## 2020-02-13 DIAGNOSIS — Z13220 Encounter for screening for lipoid disorders: Secondary | ICD-10-CM

## 2020-02-13 DIAGNOSIS — Z79899 Other long term (current) drug therapy: Secondary | ICD-10-CM

## 2020-02-13 DIAGNOSIS — Z1329 Encounter for screening for other suspected endocrine disorder: Secondary | ICD-10-CM

## 2020-02-13 DIAGNOSIS — Z0000 Encounter for general adult medical examination without abnormal findings: Secondary | ICD-10-CM

## 2020-02-13 NOTE — Telephone Encounter (Signed)
Last labs completed on 12/01/17 ANA, Rheumatoid Factor, Sed Rate, Vit D and CBC. Please advise. Thank you

## 2020-02-13 NOTE — Telephone Encounter (Signed)
Pt has cpe scheduled 6/11 and would like lab work done before appt.

## 2020-02-13 NOTE — Telephone Encounter (Signed)
Pls order, tsh, cbc, cmp, lipid panel. Fasting labs 7 days prior to appt.  Thx.   Dr. Lovena Le

## 2020-02-14 NOTE — Telephone Encounter (Signed)
Pt returned call and verbalized understanding  

## 2020-02-14 NOTE — Telephone Encounter (Signed)
Lab orders placed. Left message to return call  

## 2020-02-22 LAB — LIPID PANEL
Chol/HDL Ratio: 4.3 ratio (ref 0.0–4.4)
Cholesterol, Total: 184 mg/dL (ref 100–199)
HDL: 43 mg/dL (ref >39)
LDL Chol Calc (NIH): 123 mg/dL — ABNORMAL HIGH (ref 0–99)
Triglycerides: 101 mg/dL (ref 0–149)
VLDL Cholesterol Cal: 18 mg/dL (ref 5–40)

## 2020-02-22 LAB — BASIC METABOLIC PANEL
BUN/Creatinine Ratio: 21 (ref 9–23)
BUN: 16 mg/dL (ref 6–24)
CO2: 22 mmol/L (ref 20–29)
Calcium: 9 mg/dL (ref 8.7–10.2)
Chloride: 105 mmol/L (ref 96–106)
Creatinine, Ser: 0.78 mg/dL (ref 0.57–1.00)
GFR calc Af Amer: 102 mL/min/{1.73_m2} (ref >59)
GFR calc non Af Amer: 88 mL/min/{1.73_m2} (ref >59)
Glucose: 101 mg/dL — ABNORMAL HIGH (ref 65–99)
Potassium: 5.1 mmol/L (ref 3.5–5.2)
Sodium: 141 mmol/L (ref 134–144)

## 2020-02-22 LAB — CBC WITH DIFFERENTIAL/PLATELET
Basophils Absolute: 0 10*3/uL (ref 0.0–0.2)
Basos: 1 %
EOS (ABSOLUTE): 0.3 10*3/uL (ref 0.0–0.4)
Eos: 4 %
Hematocrit: 44.9 % (ref 34.0–46.6)
Hemoglobin: 15 g/dL (ref 11.1–15.9)
Immature Grans (Abs): 0 10*3/uL (ref 0.0–0.1)
Immature Granulocytes: 0 %
Lymphocytes Absolute: 2 10*3/uL (ref 0.7–3.1)
Lymphs: 27 %
MCH: 30.7 pg (ref 26.6–33.0)
MCHC: 33.4 g/dL (ref 31.5–35.7)
MCV: 92 fL (ref 79–97)
Monocytes Absolute: 0.6 10*3/uL (ref 0.1–0.9)
Monocytes: 8 %
Neutrophils Absolute: 4.3 10*3/uL (ref 1.4–7.0)
Neutrophils: 60 %
Platelets: 247 10*3/uL (ref 150–450)
RBC: 4.88 x10E6/uL (ref 3.77–5.28)
RDW: 12.3 % (ref 11.7–15.4)
WBC: 7.2 10*3/uL (ref 3.4–10.8)

## 2020-02-22 LAB — HEPATIC FUNCTION PANEL
ALT: 23 IU/L (ref 0–32)
AST: 17 IU/L (ref 0–40)
Albumin: 4.4 g/dL (ref 3.8–4.9)
Alkaline Phosphatase: 44 IU/L — ABNORMAL LOW (ref 48–121)
Bilirubin Total: 0.5 mg/dL (ref 0.0–1.2)
Bilirubin, Direct: 0.13 mg/dL (ref 0.00–0.40)
Total Protein: 6.8 g/dL (ref 6.0–8.5)

## 2020-02-22 LAB — TSH: TSH: 1.46 u[IU]/mL (ref 0.450–4.500)

## 2020-02-28 ENCOUNTER — Ambulatory Visit (INDEPENDENT_AMBULATORY_CARE_PROVIDER_SITE_OTHER): Payer: 59 | Admitting: Family Medicine

## 2020-02-28 ENCOUNTER — Encounter: Payer: Self-pay | Admitting: Family Medicine

## 2020-02-28 ENCOUNTER — Other Ambulatory Visit: Payer: Self-pay

## 2020-02-28 VITALS — BP 122/74 | HR 97 | Temp 97.6°F | Ht 65.0 in | Wt 179.0 lb

## 2020-02-28 DIAGNOSIS — M5432 Sciatica, left side: Secondary | ICD-10-CM | POA: Diagnosis not present

## 2020-02-28 DIAGNOSIS — Z8742 Personal history of other diseases of the female genital tract: Secondary | ICD-10-CM | POA: Insufficient documentation

## 2020-02-28 DIAGNOSIS — Z8616 Personal history of COVID-19: Secondary | ICD-10-CM

## 2020-02-28 DIAGNOSIS — F419 Anxiety disorder, unspecified: Secondary | ICD-10-CM | POA: Diagnosis not present

## 2020-02-28 DIAGNOSIS — E663 Overweight: Secondary | ICD-10-CM | POA: Insufficient documentation

## 2020-02-28 DIAGNOSIS — Z0000 Encounter for general adult medical examination without abnormal findings: Secondary | ICD-10-CM

## 2020-02-28 MED ORDER — TIZANIDINE HCL 4 MG PO CAPS: 4.0000 mg | ORAL_CAPSULE | Freq: Three times a day (TID) | ORAL | 0 refills | Status: DC | PRN

## 2020-02-28 MED ORDER — DICLOFENAC SODIUM 75 MG PO TBEC: 75.0000 mg | DELAYED_RELEASE_TABLET | Freq: Two times a day (BID) | ORAL | 1 refills | Status: DC

## 2020-02-28 MED ORDER — ALPRAZOLAM 0.5 MG PO TABS: 0.5000 mg | ORAL_TABLET | Freq: Every evening | ORAL | 2 refills | Status: DC | PRN
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
## 2020-04-06 ENCOUNTER — Other Ambulatory Visit: Payer: Self-pay

## 2020-04-06 ENCOUNTER — Ambulatory Visit (INDEPENDENT_AMBULATORY_CARE_PROVIDER_SITE_OTHER): Payer: 59 | Admitting: *Deleted

## 2020-04-06 VITALS — Ht 65.5 in | Wt 179.0 lb

## 2020-04-06 DIAGNOSIS — Z1211 Encounter for screening for malignant neoplasm of colon: Secondary | ICD-10-CM

## 2020-04-06 NOTE — Progress Notes (Signed)
Will need propofol due to meds. ASA I

## 2020-04-06 NOTE — Patient Instructions (Signed)
Amanda Woodard  [birth year] MRN: [id number]     Procedure Date: 05/11/2020 Time to register: 6:45 am Place to register: [facility] Procedure Time: 8:15 am Scheduled provider: Dr. Abbey Chatters    PREPARATION FOR COLONOSCOPY WITH SUPREP BOWEL PREP KIT  Note: Suprep Bowel Prep Kit is a split-dose (2day) regimen. Consumption of BOTH 6-ounce bottles is required for a complete prep.  Please notify us immediately if you are diabetic, take iron supplements, or if you are on Coumadin or any other blood thinners.  Please hold the following medications: n/a                                                                                                                                                  2 DAYS BEFORE PROCEDURE:  DATE: 05/09/2020   DAY: Saturday Begin clear liquid diet AFTER your lunch meal. NO SOLID FOODS after this point.  1 DAY BEFORE PROCEDURE:  DATE: 05/10/2020   DAY: Sunday Continue clear liquids the entire day - NO SOLID FOOD.   Diabetic medications adjustments for today: n/a  At 6:00pm: Complete steps 1 through 4 below, using ONE (1) 6-ounce bottle, before going to bed. Step 1:  Pour ONE (1) 6-ounce bottle of SUPREP liquid into the mixing container.  Step 2:  Add cool drinking water to the 16 ounce line on the container and mix.  Note: Dilute the solution concentrate as directed prior to use. Step 3:  DRINK ALL the liquid in the container. Step 4:  You MUST drink an additional two (2) or more 16 ounce containers of water over the next one (1) hour.   Continue clear liquids.  DAY OF PROCEDURE:   DATE: 05/11/2020   DAY: Monday If you take medications for your heart, blood pressure, or breathing, you may take these medications.  Diabetic medications adjustments for today: n/a   5 hours before your procedure at :  3:15 am Step 1:  Pour ONE (1) 6-ounce bottle of SUPREP liquid into the mixing container.  Step 2:  Add cool drinking water to the 16 ounce line on the  container and mix.  Note: Dilute the solution concentrate as directed prior to use. Step 3:  DRINK ALL the liquid in the container. Step 4:  You MUST drink an additional two (2) or more 16 ounce containers of water over the next one (1) hour. You MUST complete the final glass of water at least 3 hours before your colonoscopy. Nothing by mouth past 5:15 am.   You may take your morning medications with sip of water unless we have instructed otherwise.    Please see below for Dietary Information.  CLEAR LIQUIDS INCLUDE:  Water Jello (NOT red in color)   Ice Popsicles (NOT red in color)   Tea (sugar ok, no milk/cream) Powdered fruit flavored drinks  Coffee (  sugar ok, no milk/cream) Gatorade/ Lemonade/ Kool-Aid  (NOT red in color)   Juice: apple, white grape, white cranberry Soft drinks  Clear bullion, consomme, broth (fat free beef/chicken/vegetable)  Carbonated beverages (any kind)  Strained chicken noodle soup Hard Candy   Remember: Clear liquids are liquids that will allow you to see your fingers on the other side of a clear glass. Be sure liquids are NOT red in color, and not cloudy, but CLEAR.  DO NOT EAT OR DRINK ANY OF THE FOLLOWING:  Dairy products of any kind   Cranberry juice Tomato juice / V8 juice   Grapefruit juice Orange juice     Red grape juice  Do not eat any solid foods, including such foods as: cereal, oatmeal, yogurt, fruits, vegetables, creamed soups, eggs, bread, crackers, pureed foods in a blender, etc.   HELPFUL HINTS FOR DRINKING PREP SOLUTION:   Make sure prep is extremely cold. Mix and refrigerate the the morning of the prep. You may also put in the freezer.   You may try mixing some Crystal Light or Country Time Lemonade if you prefer. Mix in small amounts; add more if necessary.  Try drinking through a straw  Rinse mouth with water or a mouthwash between glasses, to remove after-taste.  Try sipping on a cold beverage /ice/ popsicles between glasses of  prep.  Place a piece of sugar-free hard candy in mouth between glasses.  If you become nauseated, try consuming smaller amounts, or stretch out the time between glasses. Stop for 30-60 minutes, then slowly start back drinking.     OTHER INSTRUCTIONS  You will need a responsible adult at least 52 years of age to accompany you and drive you home. This person must remain in the waiting room during your procedure. The hospital will cancel your procedure if you do not have a responsible adult with you.   1. Wear loose fitting clothing that is easily removed. 2. Leave jewelry and other valuables at home.  3. Remove all body piercing jewelry and leave at home. 4. Total time from sign-in until discharge is approximately 2-3 hours. 5. You should go home directly after your procedure and rest. You can resume normal activities the day after your procedure. 6. The day of your procedure you should not:  Drive  Make legal decisions  Operate machinery  Drink alcohol  Return to work   You may call the office (Dept: [phone]) before 5:00pm, or page the doctor on call [phone]) after 5:00pm, for further instructions, if necessary.   Insurance Information YOU WILL NEED TO CHECK WITH YOUR INSURANCE COMPANY FOR THE BENEFITS OF COVERAGE YOU HAVE FOR THIS PROCEDURE.  UNFORTUNATELY, NOT ALL INSURANCE COMPANIES HAVE BENEFITS TO COVER ALL OR PART OF THESE TYPES OF PROCEDURES.  IT IS YOUR RESPONSIBILITY TO CHECK YOUR BENEFITS, HOWEVER, WE WILL BE GLAD TO ASSIST YOU WITH ANY CODES YOUR INSURANCE COMPANY MAY NEED.    PLEASE NOTE THAT MOST INSURANCE COMPANIES WILL NOT COVER A SCREENING COLONOSCOPY FOR PEOPLE UNDER THE AGE OF 50  IF YOU HAVE BCBS INSURANCE, YOU MAY HAVE BENEFITS FOR A SCREENING COLONOSCOPY BUT IF POLYPS ARE FOUND THE DIAGNOSIS WILL CHANGE AND THEN YOU MAY HAVE A DEDUCTIBLE THAT WILL NEED TO BE MET. SO PLEASE MAKE SURE YOU CHECK YOUR BENEFITS FOR A SCREENING COLONOSCOPY AS WELL AS A  DIAGNOSTIC COLONOSCOPY.

## 2020-04-06 NOTE — Progress Notes (Addendum)
Gastroenterology Pre-Procedure Review  Request Date: 04/06/2020 Requesting Physician: Dr. Luking, no previous TCS, last triage 11/26/2018  PATIENT REVIEW QUESTIONS: The patient responded to the following health history questions as indicated:    1. Diabetes Melitis: no 2. Joint replacements in the past 12 months: no 3. Major health problems in the past 3 months: no 4. Has an artificial valve or MVP: no 5. Has a defibrillator: no 6. Has been advised in past to take antibiotics in advance of a procedure like teeth cleaning: no 7. Family history of colon cancer: no  8. Alcohol Use: yes, 1 glass of wine 5 times a week 9. Illicit drug Use: no 10. History of sleep apnea: no  11. History of coronary artery or other vascular stents placed within the last 12 months: no 12. History of any prior anesthesia complications: no 13. Body mass index is 29.33 kg/m.    MEDICATIONS & ALLERGIES:    Patient reports the following regarding taking any blood thinners:   Plavix? no Aspirin? no Coumadin? no Brilinta? no Xarelto? no Eliquis? no Pradaxa? no Savaysa? no Effient? no  Patient confirms/reports the following medications:  Current Outpatient Medications  Medication Sig Dispense Refill  . ALPRAZolam (XANAX) 0.5 MG tablet Take 1 tablet (0.5 mg total) by mouth at bedtime as needed for sleep. 30 tablet 2  . Cholecalciferol 50 MCG (2000 UT) CAPS Take by mouth daily.     . diclofenac (VOLTAREN) 75 MG EC tablet Take 1 tablet (75 mg total) by mouth 2 (two) times daily. Prn pain 60 tablet 1  . levonorgestrel (MIRENA) 20 MCG/24HR IUD by Intrauterine route.    . tiZANidine (ZANAFLEX) 4 MG capsule Take 1 capsule (4 mg total) by mouth 3 (three) times daily as needed for muscle spasms. (Patient taking differently: Take 4 mg by mouth as needed for muscle spasms. ) 30 capsule 0  . Na Sulfate-K Sulfate-Mg Sulf (SUPREP BOWEL PREP KIT) 17.5-3.13-1.6 GM/177ML SOLN Take 1 kit by mouth as directed. (Patient not  taking: Reported on 04/06/2020) 1 Bottle 0   No current facility-administered medications for this visit.    Patient confirms/reports the following allergies:  No Known Allergies  No orders of the defined types were placed in this encounter.   AUTHORIZATION INFORMATION Primary Insurance: [employer],  ID #: [id number],  Group #: [id number] Pre-Cert / Auth required: Yes, approved per Jenny for 05/09/20-08/07/20 Ref#: [id number] Pre-Cert / Auth #:  [id number]  SCHEDULE INFORMATION: Procedure has been scheduled as follows:  Date: 05/11/2020, Time: 8:15 Location: [facility] with Dr. Carver  This Gastroenterology Pre-Precedure Review Form is being routed to the following provider(s): Eric Gill, NP  

## 2020-04-08 ENCOUNTER — Telehealth: Payer: Self-pay | Admitting: *Deleted

## 2020-04-08 NOTE — Telephone Encounter (Signed)
Called [employer] to initiate auth for procedure. Spoke to [place] REF#: [id number].  Auth created and is pending.  [id number].  [employer] to call me back once decision has been made.

## 2020-04-14 ENCOUNTER — Other Ambulatory Visit: Payer: Self-pay | Admitting: *Deleted

## 2020-04-14 NOTE — Telephone Encounter (Signed)
Called [facility] and spoke to [place] REF# [id number].  Procedure was approved and auth good for 05/09/20-08/07/20.  Auth#: [id number]

## 2020-04-28 ENCOUNTER — Other Ambulatory Visit: Payer: Self-pay

## 2020-04-28 ENCOUNTER — Telehealth: Payer: Self-pay | Admitting: Internal Medicine

## 2020-04-28 ENCOUNTER — Encounter (HOSPITAL_COMMUNITY): Payer: Self-pay

## 2020-04-28 ENCOUNTER — Encounter (HOSPITAL_COMMUNITY)
Admission: RE | Admit: 2020-04-28 | Discharge: 2020-04-28 | Disposition: A | Discharge status: Home / Self Care | Payer: 59 | Source: Ambulatory Visit | Attending: Internal Medicine | Admitting: Internal Medicine

## 2020-04-28 MED ORDER — NA SULFATE-K SULFATE-MG SULF 17.5-3.13-1.6 GM/177ML PO SOLN
1.0000 | Freq: Once | ORAL | 0 refills | Status: AC
Start: 2020-04-28 — End: 2020-04-28

## 2020-04-28 NOTE — Addendum Note (Signed)
Addended by: Metro Kung on: 04/28/2020 02:42 PM   Modules accepted: Orders

## 2020-04-28 NOTE — Telephone Encounter (Signed)
Pt called to say that [facility] hasn't received her prep yet. [phone]

## 2020-04-28 NOTE — Telephone Encounter (Signed)
Sent RX for [employer] to [facility].  The one we have on file is over a year old.  Called pt to inform her that we took care of it.

## 2020-04-28 NOTE — Telephone Encounter (Addendum)
Pre-service Center called and informed me that [facility] web site has wrong information listed (facility and billing info).  Called [facility] and was told that they could not edit or correct current auth on file.  The agent, MJ informed me that a new auth would have to be created.  I requested to submit a new one.  New auth pending for 5 days per MJ.  Ref#: [id number].  She informed me that if approved then it would be good for 90 days (05/09/20-08/09/20) Pending auth: [id number]

## 2020-05-04 ENCOUNTER — Telehealth: Payer: Self-pay | Admitting: Gastroenterology

## 2020-05-04 NOTE — Telephone Encounter (Signed)
Hi Dr. Havery Moros,  We received a call from [place] patient was scheduled for direct colonoscopy however the insurance denied services due to outpatient hospital setting. They are requesting that we take on the patient since you are able to perform at [facility]. Records are available in Epic   Please advise on approval for scheduling.   Thank you

## 2020-05-04 NOTE — Telephone Encounter (Signed)
Caren Griffins from [facility] called and said that the authorization was not approved.  She said that the procedure has to be done at an ambulatory site per [facility].  Otherwise, it could be denied and may have to go through the appeals process and there was no guarantee that it would be approved then.  Ref#: [id number]  Called and spoke to pt and she requested that we cancel the procedure.  Gave pt the phone number to [facility] so she could proceed to have procedure done at an ambulatory site.

## 2020-05-04 NOTE — Telephone Encounter (Signed)
I'm happy to perform her procedure. From review of record I don't see any high risk medical history that would prohibit her from having a procedure done at the [facility] unless she has a difficult airway. Okay to proceed with direct booking at the [facility] unless you find something in her chart that would be prohibitive. Thanks

## 2020-05-08 ENCOUNTER — Other Ambulatory Visit (HOSPITAL_COMMUNITY): Payer: 59

## 2020-05-08 ENCOUNTER — Encounter (HOSPITAL_COMMUNITY): Payer: 59

## 2020-05-11 ENCOUNTER — Encounter (HOSPITAL_COMMUNITY): Admission: RE | Payer: Self-pay | Source: Home / Self Care

## 2020-05-11 ENCOUNTER — Ambulatory Visit (HOSPITAL_COMMUNITY): Admission: RE | Admit: 2020-05-11 | Payer: 59 | Source: Home / Self Care

## 2020-05-11 SURGERY — COLONOSCOPY WITH PROPOFOL
Anesthesia: Monitor Anesthesia Care

## 2020-07-03 ENCOUNTER — Other Ambulatory Visit: Payer: Self-pay

## 2020-07-03 ENCOUNTER — Ambulatory Visit (AMBULATORY_SURGERY_CENTER): Payer: Self-pay | Admitting: *Deleted

## 2020-07-03 VITALS — Ht 65.0 in | Wt 174.0 lb

## 2020-07-03 DIAGNOSIS — Z1211 Encounter for screening for malignant neoplasm of colon: Secondary | ICD-10-CM

## 2020-07-03 NOTE — Progress Notes (Signed)

## 2020-07-06 ENCOUNTER — Encounter: Payer: Self-pay | Admitting: Gastroenterology

## 2020-07-17 ENCOUNTER — Encounter: Payer: Self-pay | Admitting: Gastroenterology

## 2020-07-17 ENCOUNTER — Other Ambulatory Visit: Payer: Self-pay

## 2020-07-17 ENCOUNTER — Ambulatory Visit (AMBULATORY_SURGERY_CENTER): Payer: 59 | Admitting: Gastroenterology

## 2020-07-17 VITALS — BP 110/71 | HR 67 | Temp 97.3°F | Resp 17 | Ht 65.0 in | Wt 174.0 lb

## 2020-07-17 DIAGNOSIS — D120 Benign neoplasm of cecum: Secondary | ICD-10-CM

## 2020-07-17 DIAGNOSIS — K635 Polyp of colon: Secondary | ICD-10-CM

## 2020-07-17 DIAGNOSIS — D122 Benign neoplasm of ascending colon: Secondary | ICD-10-CM | POA: Diagnosis not present

## 2020-07-17 DIAGNOSIS — Z1211 Encounter for screening for malignant neoplasm of colon: Secondary | ICD-10-CM

## 2020-07-17 MED ORDER — SODIUM CHLORIDE 0.9 % IV SOLN: 500.0000 mL | Freq: Once | INTRAVENOUS | Status: DC

## 2020-07-17 NOTE — Op Note (Signed)
[facility] Patient Name: Amanda Woodard Procedure Date: 07/17/2020 9:11 AM MRN: [id number] Endoscopist: Remo Lipps P. Havery Moros , MD Age: 54 Referring MD:  Date of Birth: 1965-11-14 Gender: Female Account #: [id number] Procedure:                Colonoscopy Indications:              Screening for colorectal malignant neoplasm, This                            is the patient's first colonoscopy Medicines:                Monitored Anesthesia Care Procedure:                Pre-Anesthesia Assessment:                           - Prior to the procedure, a History and Physical                            was performed, and patient medications and                            allergies were reviewed. The patient's tolerance of                            previous anesthesia was also reviewed. The risks                            and benefits of the procedure and the sedation                            options and risks were discussed with the patient.                            All questions were answered, and informed consent                            was obtained. Prior Anticoagulants: The patient has                            taken no previous anticoagulant or antiplatelet                            agents. ASA Grade Assessment: II - A patient with                            mild systemic disease. After reviewing the risks                            and benefits, the patient was deemed in                            satisfactory condition to undergo the procedure.  After obtaining informed consent, the colonoscope                            was passed under direct vision. Throughout the                            procedure, the patient's blood pressure, pulse, and                            oxygen saturations were monitored continuously. The                            Colonoscope was introduced through the anus and                            advanced to the the  cecum, identified by                            appendiceal orifice and ileocecal valve. The                            colonoscopy was performed without difficulty. The                            patient tolerated the procedure well. The quality                            of the bowel preparation was adequate. The                            ileocecal valve, appendiceal orifice, and rectum                            were photographed. Scope In: 9:15:27 AM Scope Out: 9:34:47 AM Scope Withdrawal Time: 0 hours 16 minutes 44 seconds  Total Procedure Duration: 0 hours 19 minutes 20 seconds  Findings:                 The perianal and digital rectal examinations were                            normal.                           A 3 mm polyp was found in the cecum. The polyp was                            sessile. The polyp was removed with a cold snare.                            Resection and retrieval were complete.                           A 4 mm polyp was found in the ascending colon. The  polyp was flat. The polyp was removed with a cold                            snare. Resection and retrieval were complete.                           Multiple medium-mouthed diverticula were found in                            the sigmoid colon.                           Anal papilla(e) were hypertrophied.                           The exam was otherwise without abnormality. Prep                            took some time to clear in the right colon with                            lavage, but adequate views were obtained following                            this process. Complications:            No immediate complications. Estimated blood loss:                            Minimal. Estimated Blood Loss:     Estimated blood loss was minimal. Impression:               - One 3 mm polyp in the cecum, removed with a cold                            snare. Resected and retrieved.                            - One 4 mm polyp in the ascending colon, removed                            with a cold snare. Resected and retrieved.                           - Diverticulosis in the sigmoid colon.                           - Anal papilla(e) were hypertrophied.                           - The examination was otherwise normal. Recommendation:           - Patient has a contact number available for                            emergencies. The signs and  symptoms of potential                            delayed complications were discussed with the                            patient. Return to normal activities tomorrow.                            Written discharge instructions were provided to the                            patient.                           - Resume previous diet.                           - Continue present medications.                           - Await pathology results. Remo Lipps P. Chistine Dematteo, MD 07/17/2020 9:38:22 AM This report has been signed electronically.

## 2020-07-17 NOTE — Progress Notes (Signed)
PT taken to PACU. Monitors in place. VSS. Report given to RN. 

## 2020-07-17 NOTE — Progress Notes (Signed)
Medical history reviewed with no changes noted. VS assessed by C.W 

## 2020-07-17 NOTE — Patient Instructions (Signed)
Handouts provided on polyps and diverticulosis.  ? ?YOU HAD AN ENDOSCOPIC PROCEDURE TODAY AT THE [facility]:   Refer to the procedure report that was given to you for any specific questions about what was found during the examination.  If the procedure report does not answer your questions, please call your gastroenterologist to clarify.  If you requested that your care partner not be given the details of your procedure findings, then the procedure report has been included in a sealed envelope for you to review at your convenience later. ? ?YOU SHOULD EXPECT: Some feelings of bloating in the abdomen. Passage of more gas than usual.  Walking can help get rid of the air that was put into your GI tract during the procedure and reduce the bloating. If you had a lower endoscopy (such as a colonoscopy or flexible sigmoidoscopy) you may notice spotting of blood in your stool or on the toilet paper. If you underwent a bowel prep for your procedure, you may not have a normal bowel movement for a few days. ? ?Please Note:  You might notice some irritation and congestion in your nose or some drainage.  This is from the oxygen used during your procedure.  There is no need for concern and it should clear up in a day or so. ? ?SYMPTOMS TO REPORT IMMEDIATELY: ? ?Following lower endoscopy (colonoscopy or flexible sigmoidoscopy): ? Excessive amounts of blood in the stool ? Significant tenderness or worsening of abdominal pains ? Swelling of the abdomen that is new, acute ? Fever of 100?F or higher ? ?For urgent or emergent issues, a gastroenterologist can be reached at any hour by calling [phone]. ?Do not use MyChart messaging for urgent concerns.  ? ? ?DIET:  We do recommend a small meal at first, but then you may proceed to your regular diet.  Drink plenty of fluids but you should avoid alcoholic beverages for 24 hours. ? ?ACTIVITY:  You should plan to take it easy for the rest of today and you should NOT  DRIVE or use heavy machinery until tomorrow (because of the sedation medicines used during the test).   ? ?FOLLOW UP: ?Our staff will call the number listed on your records 48-72 hours following your procedure to check on you and address any questions or concerns that you may have regarding the information given to you following your procedure. If we do not reach you, we will leave a message.  We will attempt to reach you two times.  During this call, we will ask if you have developed any symptoms of COVID 19. If you develop any symptoms (ie: fever, flu-like symptoms, shortness of breath, cough etc.) before then, please call [phone].  If you test positive for Covid 19 in the 2 weeks post procedure, please call and report this information to us.   ? ?If any biopsies were taken you will be contacted by phone or by letter within the next 1-3 weeks.  Please call us at [phone] if you have not heard about the biopsies in 3 weeks.  ? ? ?SIGNATURES/CONFIDENTIALITY: ?You and/or your care partner have signed paperwork which will be entered into your electronic medical record.  These signatures attest to the fact that that the information above on your After Visit Summary has been reviewed and is understood.  Full responsibility of the confidentiality of this discharge information lies with you and/or your care-partner. ? ?

## 2020-07-21 ENCOUNTER — Telehealth: Payer: Self-pay | Admitting: *Deleted

## 2020-07-21 ENCOUNTER — Telehealth: Payer: Self-pay

## 2020-07-21 NOTE — Telephone Encounter (Signed)
Left message on follow up call. 

## 2020-07-21 NOTE — Telephone Encounter (Signed)
  Follow up Call-  Call back number 07/17/2020  Post procedure Call Back phone  # [phone]  Permission to leave phone message Yes  Some recent data might be hidden     Patient questions:  Message left to call us if necessary.  Second call.

## 2020-07-21 NOTE — Telephone Encounter (Signed)
Pt calle to say she was doing great

## 2020-07-23 ENCOUNTER — Telehealth: Payer: Self-pay

## 2020-07-23 DIAGNOSIS — M5432 Sciatica, left side: Secondary | ICD-10-CM

## 2020-07-23 MED ORDER — TIZANIDINE HCL 4 MG PO TABS: 4.0000 mg | ORAL_TABLET | Freq: Three times a day (TID) | ORAL | 2 refills | Status: DC | PRN

## 2020-07-23 MED ORDER — DICLOFENAC SODIUM 75 MG PO TBEC: 75.0000 mg | DELAYED_RELEASE_TABLET | Freq: Two times a day (BID) | ORAL | 2 refills | Status: DC

## 2020-07-23 NOTE — Telephone Encounter (Signed)
Refills sent in and pt is aware °

## 2020-07-23 NOTE — Telephone Encounter (Signed)
Pt needs refill on diclofenac (VOLTAREN) 75 MG EC tablet,  tiZANidine (ZANAFLEX) 4 MG tablet Sent to [facility] [place] [facility] [place]   Pt call back [phone]

## 2020-07-23 NOTE — Telephone Encounter (Signed)
Pt last seen 02/28/20 for wellness. Please advise. Thank you

## 2020-07-23 NOTE — Telephone Encounter (Signed)
Pls give 30 day supply with 2 refills. Thx. Dr. Lovena Le

## 2021-02-04 ENCOUNTER — Telehealth (INDEPENDENT_AMBULATORY_CARE_PROVIDER_SITE_OTHER): Payer: 59 | Admitting: Family Medicine

## 2021-02-04 ENCOUNTER — Other Ambulatory Visit: Payer: Self-pay

## 2021-02-04 ENCOUNTER — Telehealth: Payer: Self-pay | Admitting: Family Medicine

## 2021-02-04 DIAGNOSIS — R059 Cough, unspecified: Secondary | ICD-10-CM | POA: Diagnosis not present

## 2021-02-04 DIAGNOSIS — J019 Acute sinusitis, unspecified: Secondary | ICD-10-CM

## 2021-02-04 DIAGNOSIS — R0789 Other chest pain: Secondary | ICD-10-CM | POA: Diagnosis not present

## 2021-02-04 DIAGNOSIS — B9689 Other specified bacterial agents as the cause of diseases classified elsewhere: Secondary | ICD-10-CM

## 2021-02-04 DIAGNOSIS — U071 COVID-19: Secondary | ICD-10-CM | POA: Insufficient documentation

## 2021-02-04 MED ORDER — BENZONATATE 100 MG PO CAPS: 100.0000 mg | ORAL_CAPSULE | Freq: Two times a day (BID) | ORAL | 0 refills | Status: DC | PRN

## 2021-02-04 MED ORDER — AZITHROMYCIN 250 MG PO TABS: ORAL_TABLET | ORAL | 0 refills | Status: DC

## 2021-02-04 MED ORDER — ALBUTEROL SULFATE HFA 108 (90 BASE) MCG/ACT IN AERS: 2.0000 | INHALATION_SPRAY | Freq: Four times a day (QID) | RESPIRATORY_TRACT | 0 refills | Status: DC | PRN

## 2021-02-04 NOTE — Progress Notes (Signed)
Patient ID: Amanda Woodard, female    DOB: 04-19-1966, 55 y.o.   MRN: [id number]   Chief Complaint  Patient presents with  . Cough   Subjective:  CC: covid positive  This is a new problem.  Presents today via telephone visit with a complaint of COVID positive, cough started on Sunday, worsened by Tuesday, symptoms include congestion, cough, stuffy nose, runny nose.  Also endorses some chest tightness, but no shortness of breath.  Reports that she also mowed the lawn on Monday, pollen count was high, feels as though she has a sinus infection.  To note patient has had 2 doses of Moderna vaccine, and previous COVID infection in November 2020.  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  See review of systems.   Pt began to have cough on Sunday, worsened on Tuesday. Mowed grass on Monday. Having congestion, cough, stuffy nose and at times runny noses. No shortness of breath but did feel some tightness in chest. 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 Pt has been coughing up some phelgm. Took 2 at home test and they both were positive.    Medical History Amanda Woodard has a past medical history of Vitamin D deficiency.   Outpatient Encounter Medications as of 02/04/2021  Medication Sig  . albuterol (VENTOLIN HFA) 108 (90 Base) MCG/ACT inhaler Inhale 2 puffs into the lungs every 6 (six) hours as needed for wheezing or shortness of breath.  . ALPRAZolam (XANAX) 0.5 MG tablet Take 1 tablet (0.5 mg total) by mouth at bedtime as needed for sleep. (Patient taking differently: Take 0.5 mg by mouth daily as needed for anxiety or sleep.)  . azithromycin (ZITHROMAX) 250 MG tablet Take 2 by mouth on day one, then one by mouth daily for 4 days  . benzonatate (TESSALON) 100 MG capsule Take 1 capsule (100 mg total) by mouth 2 (two) times daily as needed for cough.  . Cholecalciferol 50 MCG (2000 UT) CAPS Take 2,000 Units by  mouth once a week.  . diclofenac (VOLTAREN) 75 MG EC tablet Take 1 tablet (75 mg total) by mouth 2 (two) times daily. Prn pain  . diclofenac Sodium (VOLTAREN) 1 % GEL Apply 1 application topically 4 (four) times daily as needed (back pain.).  Marland Kitchen ibuprofen (ADVIL) 200 MG tablet Take 400-800 mg by mouth every 8 (eight) hours as needed (pain/headaches.).  Marland Kitchen levonorgestrel (MIRENA) 20 MCG/24HR IUD 1 each by Intrauterine route once.   . loratadine-pseudoephedrine (CLARITIN-D 24-HOUR) 10-240 MG 24 hr tablet Take 1 tablet by mouth daily.  Marland Kitchen tiZANidine (ZANAFLEX) 4 MG tablet Take 1 tablet (4 mg total) by mouth 3 (three) times daily as needed for muscle spasms.   No facility-administered encounter medications on file as of 02/04/2021.     Review of Systems  Constitutional: Negative for chills and fever.  HENT: Positive for congestion, rhinorrhea, sinus pressure and sinus pain.   Respiratory: Positive for cough and chest tightness. Negative for shortness of breath.   Gastrointestinal: Negative for nausea and vomiting.  Neurological: Negative for headaches.     Vitals There were no vitals taken for this visit. Unable. Will monitor oxygen saturations : goal stay > 93%  Objective:   Physical Exam Unable to perform, no shortness of breath noted throughout telephone interview.  Assessment and Plan   1. COVID-19 virus infection - albuterol (VENTOLIN HFA) 108 (90 Base) MCG/ACT  inhaler; Inhale 2 puffs into the lungs every 6 (six) hours as needed for wheezing or shortness of breath.  Dispense: 8 g; Refill: 0 - benzonatate (TESSALON) 100 MG capsule; Take 1 capsule (100 mg total) by mouth 2 (two) times daily as needed for cough.  Dispense: 20 capsule; Refill: 0  2. Acute bacterial rhinosinusitis - azithromycin (ZITHROMAX) 250 MG tablet; Take 2 by mouth on day one, then one by mouth daily for 4 days  Dispense: 6 tablet; Refill: 0  3. Cough - benzonatate (TESSALON) 100 MG capsule; Take 1 capsule (100  mg total) by mouth 2 (two) times daily as needed for cough.  Dispense: 20 capsule; Refill: 0  4. Chest tightness - albuterol (VENTOLIN HFA) 108 (90 Base) MCG/ACT inhaler; Inhale 2 puffs into the lungs every 6 (six) hours as needed for wheezing or shortness of breath.  Dispense: 8 g; Refill: 0   Shared decision making concerning outpatient oral antiviral therapy.  She is day 4 of infection, symptoms moderate, no shortness of breath.  She will not start oral antiviral therapy today.  Symptomatic treatment includes albuterol for chest tightness, Tessalon Perles for cough, Z-Pak for sinus infection, recommend sinus rinses, over-the-counter cough medications as needed, adequate hydration, and supportive therapy.  Agrees with plan of care discussed today. Understands warning signs to seek further care: chest pain, shortness of breath, any significant change in health.  Understands to follow-up if symptoms worsen, do not improve.  Will send COVID warning information via my chart.  She has a oxygen saturation meter at home, she will begin to monitor this throughout the day.  Instructed that if oxygen saturation falls below 93% she should seek attention at the emergency department.  Covid-19 warning:  Covid-19 is a virus that causes hypoxia (low oxygen level in blood) in some people. If you develop any changes in your usual breathing pattern: difficulty catching your breath, more short winded with activity or with resting, or anything that concerns you about your breathing, do not hesitate to go to the emergency department immediately for evaluation. Covid infection can also affect the way the brain functions if it lacks oxygen, such as, feeling dizzy, passing out, or feeling confused, if you experience any of these symptoms, please do not delay to seek treatment.  Some people experience gastrointestinal problems with Covid, such as vomiting and diarrhea, dehydration is a serious risk and should be avoided. If  you are unable to keep liquids down you may need to go to the emergency department for intravenous fluids to avoid dehydration.   Please alert and involve your family and/or friends to help keep an eye on you while you recover from Covid-19. If you have any questions or concerns about your recovery, please do not hesitate to call the office for guidance.      Chalmers Guest, NP 02/04/2021 Virtual Visit via Telephone Note  I connected with Amanda Woodard on 02/04/21 at  9:00 AM EDT by telephone and verified that I am speaking with the correct person using two identifiers.  Location: Patient: home Provider: office   I discussed the limitations, risks, security and privacy concerns of performing an evaluation and management service by telephone and the availability of in person appointments. I also discussed with the patient that there may be a patient responsible charge related to this service. The patient expressed understanding and agreed to proceed.   History of Present Illness:    Observations/Objective:   Assessment and Plan:   Follow  Up Instructions:    I discussed the assessment and treatment plan with the patient. The patient was provided an opportunity to ask questions and all were answered. The patient agreed with the plan and demonstrated an understanding of the instructions.   The patient was advised to call back or seek an in-person evaluation if the symptoms worsen or if the condition fails to improve as anticipated.  I provided 10 minutes of non-face-to-face time during this encounter.

## 2021-02-04 NOTE — Telephone Encounter (Signed)
Ms. Amanda Woodard, Amanda Woodard are scheduled for a virtual visit with your provider today.    Just as we do with appointments in the office, we must obtain your consent to participate.  Your consent will be active for this visit and any virtual visit you may have with one of our providers in the next 365 days.    If you have a MyChart account, I can also send a copy of this consent to you electronically.  All virtual visits are billed to your insurance company just like a traditional visit in the office.  As this is a virtual visit, video technology does not allow for your provider to perform a traditional examination.  This may limit your provider's ability to fully assess your condition.  If your provider identifies any concerns that need to be evaluated in person or the need to arrange testing such as labs, EKG, etc, we will make arrangements to do so.    Although advances in technology are sophisticated, we cannot ensure that it will always work on either your end or our end.  If the connection with a video visit is poor, we may have to switch to a telephone visit.  With either a video or telephone visit, we are not always able to ensure that we have a secure connection.   I need to obtain your verbal consent now.   Are you willing to proceed with your visit today?   Amanda Woodard has provided verbal consent on 02/04/2021 for a virtual visit (video or telephone).   Vicente Males, LPN 01/04/4080  4:48 AM

## 2021-02-04 NOTE — Patient Instructions (Signed)
  Covid-19 warning: Covid-19 is a virus that causes hypoxia (low oxygen level in blood) in some people. If you develop any changes in your usual breathing pattern: difficulty catching your breath, more short winded with activity or with resting, or anything that concerns you about your breathing, do not hesitate to go to the emergency department immediately for evaluation. Covid infection can also affect the way the brain functions if it lacks oxygen, such as, feeling dizzy, passing out, or feeling confused, if you experience any of these symptoms, please do not delay to seek treatment.  Some people experience gastrointestinal problems with Covid, such as vomiting and diarrhea, dehydration is a serious risk and should be avoided. If you are unable to keep liquids down you may need to go to the emergency department for intravenous fluids to avoid dehydration.   Please alert and involve your family and/or friends to help keep an eye on you while you recover from Covid-19. If you have any questions or concerns about your recovery, please do not hesitate to call the office for guidance.        10 Things You Can Do to Manage Your COVID-19 Symptoms at Home If you have possible or confirmed COVID-19: 1. Stay home except to get medical care. 2. Monitor your symptoms carefully. If your symptoms get worse, call your healthcare provider immediately. 3. Get rest and stay hydrated. 4. If you have a medical appointment, call the healthcare provider ahead of time and tell them that you have or may have COVID-19. 5. For medical emergencies, call 911 and notify the dispatch personnel that you have or may have COVID-19. 6. Cover your cough and sneezes with a tissue or use the inside of your elbow. 7. Wash your hands often with soap and water for at least 20 seconds or clean your hands with an alcohol-based hand sanitizer that contains at least 60% alcohol. 8. As much as possible, stay in a specific room and away  from other people in your home. Also, you should use a separate bathroom, if available. If you need to be around other people in or outside of the home, wear a mask. 9. Avoid sharing personal items with other people in your household, like dishes, towels, and bedding. 10. Clean all surfaces that are touched often, like counters, tabletops, and doorknobs. Use household cleaning sprays or wipes according to the label instructions. [email or web address] 04/03/2020 This information is not intended to replace advice given to you by your health care provider. Make sure you discuss any questions you have with your health care provider. Document Revised: 07/20/2020 Document Reviewed: 07/20/2020 Elsevier Patient Education  2021 [employer].

## 2021-04-24 ENCOUNTER — Emergency Department (HOSPITAL_COMMUNITY): Payer: 59

## 2021-04-24 ENCOUNTER — Emergency Department (HOSPITAL_COMMUNITY)
Admission: EM | Admit: 2021-04-24 | Discharge: 2021-04-24 | Disposition: A | Discharge status: Home / Self Care | Payer: 59 | Source: Home / Self Care | Attending: Emergency Medicine | Admitting: Emergency Medicine

## 2021-04-24 ENCOUNTER — Encounter (HOSPITAL_COMMUNITY): Payer: Self-pay | Admitting: *Deleted

## 2021-04-24 ENCOUNTER — Other Ambulatory Visit: Payer: Self-pay

## 2021-04-24 DIAGNOSIS — Z8616 Personal history of COVID-19: Secondary | ICD-10-CM | POA: Insufficient documentation

## 2021-04-24 DIAGNOSIS — K611 Rectal abscess: Secondary | ICD-10-CM | POA: Diagnosis not present

## 2021-04-24 DIAGNOSIS — R000 Tachycardia, unspecified: Secondary | ICD-10-CM | POA: Insufficient documentation

## 2021-04-24 DIAGNOSIS — K6289 Other specified diseases of anus and rectum: Secondary | ICD-10-CM

## 2021-04-24 DIAGNOSIS — Z20822 Contact with and (suspected) exposure to covid-19: Secondary | ICD-10-CM | POA: Diagnosis not present

## 2021-04-24 DIAGNOSIS — K602 Anal fissure, unspecified: Secondary | ICD-10-CM

## 2021-04-24 DIAGNOSIS — R102 Pelvic and perineal pain: Secondary | ICD-10-CM | POA: Diagnosis present

## 2021-04-24 LAB — COMPREHENSIVE METABOLIC PANEL
ALT: 17 U/L (ref 0–44)
AST: 15 U/L (ref 15–41)
Albumin: 4.1 g/dL (ref 3.5–5.0)
Alkaline Phosphatase: 50 U/L (ref 38–126)
Anion gap: 8 (ref 5–15)
BUN: 15 mg/dL (ref 6–20)
CO2: 25 mmol/L (ref 22–32)
Calcium: 8.8 mg/dL — ABNORMAL LOW (ref 8.9–10.3)
Chloride: 104 mmol/L (ref 98–111)
Creatinine, Ser: 0.85 mg/dL (ref 0.44–1.00)
GFR, Estimated: >60 mL/min (ref >60)
Glucose, Bld: 102 mg/dL — ABNORMAL HIGH (ref 70–99)
Potassium: 3.6 mmol/L (ref 3.5–5.1)
Sodium: 137 mmol/L (ref 135–145)
Total Bilirubin: 0.8 mg/dL (ref 0.3–1.2)
Total Protein: 7.2 g/dL (ref 6.5–8.1)

## 2021-04-24 LAB — CBC WITH DIFFERENTIAL/PLATELET
Abs Immature Granulocytes: 0.09 10*3/uL — ABNORMAL HIGH (ref 0.00–0.07)
Basophils Absolute: 0 10*3/uL (ref 0.0–0.1)
Basophils Relative: 0 %
Eosinophils Absolute: 0.2 10*3/uL (ref 0.0–0.5)
Eosinophils Relative: 1 %
HCT: 44.8 % (ref 36.0–46.0)
Hemoglobin: 14.6 g/dL (ref 12.0–15.0)
Immature Granulocytes: 1 %
Lymphocytes Relative: 9 %
Lymphs Abs: 1.3 10*3/uL (ref 0.7–4.0)
MCH: 31.5 pg (ref 26.0–34.0)
MCHC: 32.6 g/dL (ref 30.0–36.0)
MCV: 96.8 fL (ref 80.0–100.0)
Monocytes Absolute: 0.8 10*3/uL (ref 0.1–1.0)
Monocytes Relative: 6 %
Neutro Abs: 11.1 10*3/uL — ABNORMAL HIGH (ref 1.7–7.7)
Neutrophils Relative %: 83 %
Platelets: 267 10*3/uL (ref 150–400)
RBC: 4.63 MIL/uL (ref 3.87–5.11)
RDW: 12.2 % (ref 11.5–15.5)
WBC: 13.4 10*3/uL — ABNORMAL HIGH (ref 4.0–10.5)
nRBC: 0 % (ref 0.0–0.2)

## 2021-04-24 LAB — URINALYSIS, ROUTINE W REFLEX MICROSCOPIC
Bilirubin Urine: NEGATIVE
Glucose, UA: NEGATIVE mg/dL
Hgb urine dipstick: NEGATIVE
Ketones, ur: 5 mg/dL — AB
Leukocytes,Ua: NEGATIVE
Nitrite: NEGATIVE
Protein, ur: NEGATIVE mg/dL
Specific Gravity, Urine: >1.046 — ABNORMAL HIGH (ref 1.005–1.030)
pH: 6 (ref 5.0–8.0)

## 2021-04-24 MED ORDER — IOHEXOL 300 MG/ML  SOLN
100.0000 mL | Freq: Once | INTRAMUSCULAR | Status: AC | PRN
  Administered 2021-04-24: 100 mL via INTRAVENOUS

## 2021-04-24 MED ORDER — IOHEXOL 350 MG/ML SOLN: 80.0000 mL | Freq: Once | INTRAVENOUS | Status: DC | PRN

## 2021-04-24 MED ORDER — LORAZEPAM 2 MG/ML IJ SOLN
0.5000 mg | Freq: Once | INTRAMUSCULAR | Status: AC
  Administered 2021-04-24: 0.5 mg via INTRAVENOUS
  Filled 2021-04-24: qty 1

## 2021-04-24 MED ORDER — LIDOCAINE (ANORECTAL) 5 % EX CREA: 1.0000 "application " | TOPICAL_CREAM | Freq: Three times a day (TID) | CUTANEOUS | 0 refills | Status: DC | PRN

## 2021-04-24 MED ORDER — SODIUM CHLORIDE 0.9 % IV BOLUS
1000.0000 mL | Freq: Once | INTRAVENOUS | Status: AC
  Administered 2021-04-24: 1000 mL via INTRAVENOUS

## 2021-04-24 MED ORDER — RECTIV 0.4 % RE OINT: 1.0000 [in_us] | TOPICAL_OINTMENT | Freq: Every day | RECTAL | 0 refills | Status: DC

## 2021-04-24 NOTE — Discharge Instructions (Signed)
You have a anal fissure which is causing you pain, I have prescribed you lidocaine gel as well as nitroglycerin please apply this to the affected area daily as needed for pain.  Also recommend MiraLAX as to help soften your stools this works only if you drink plenty of fluids so please stay hydrated.  Also recommend increasing your fiber intake as well as water intake as this will also help with softening her stools.  Please follow-up with your primary care provider and/or GI doctor for further evaluation.  Come back to the emergency department if you develop chest pain, shortness of breath, severe abdominal pain, uncontrolled nausea, vomiting, diarrhea.

## 2021-04-24 NOTE — ED Provider Notes (Signed)
Patient was received at shift change from [facility], [facility] she provided HPI, current work-up, likely disposition please see her note for full detail.  In short patient with significant medical history of anxiety presents to the emergency department with chief complaint of rectal pain and pressure x1 week, symptoms started a few days ago after she lifted up some heavy furniture, has a constant pressure-like sensation around her rectum, concerned it was hemorrhoids but pain has persisted despite suppository and sitz bath's, denies hematochezia or melena, has no other complaints at this time.  Work-up reveals leukocytosis of 13.4, CMP shows hyperglycemia 102, previous provider performed rectal exam no external or internal hemorrhoids noted, no internal fistulas or gross abnormalities noted.  She does state that patient had a anal fissure.  Plan per previous provider follow-up on CT abdomen pelvis if unremarkable with lab work patient be discharged home.  CT abdomen pelvis reveals diverticulosis without diverticulitis, small hiatal hernia, large benign appearing splenic cyst, 2.9 simple cyst in the right ovary now recommend very follow-up.  I suspect patient's pain is likely from an anal fissure as seen by previous provider, there is no evidence of abscess and/or fistula seen on CT abdomen pelvis, I doubt this is complicated diverticulitis as no diverticulosis seen on CT abdomen pelvis, she denies any abdominal pain, constipation, diarrhea, hematochezia or melena.  Low suspicion for systemic infection patient is nontoxic-appearing, no obvious source infection present on exam.  She does have a slightly elevated white count but I suspect this is more acute phase reactant.  Patient is still slightly tachycardic but has significantly improved from arrival, I suspect this is secondary due to pain and anxiety which is likely caused by the anal fissure.  Will start patient on lidocaine cream as well as nitro  cream for anal fissure recommend MiraLAX follow-up with GI for further evaluation.       Marcello Fennel, PA-C 04/24/21 2206    Milton Ferguson, MD 04/25/21 [phone]

## 2021-04-24 NOTE — ED Triage Notes (Addendum)
Pt lifted some heavy furniture on Thursday. Pt with pressure at rectum since Saturday after flying, pt is a flight attendant.  Pt states she does not have anything external at rectum.

## 2021-04-24 NOTE — ED Provider Notes (Signed)
[facility] EMERGENCY DEPARTMENT Provider Note   CSN: [id number] Arrival date & time: 04/24/21  1730     History Chief Complaint  Patient presents with   Rectal Pain    Amanda Woodard is a 55 y.o. female.  HPI      Amanda Woodard is a 55 y.o. female who presents to the Emergency Department complaining of rectal pain and pressure x1 week.  She states her symptoms began 2 days after lifting heavy furniture.  She describes having a constant pressure-like sensation to her rectal area.  She initially thought that she had a hemorrhoid, she has been using over-the-counter suppositories and sitz bath's without relief.  No history of hemorrhoids.  She denies any bloody stools or straining to have bowel movement.  States her stools have been soft.  She denies any abdominal pain, chest pain, shortness of breath fever or chills.  Dysuria or back pain.    Past Medical History:  Diagnosis Date   Vitamin D deficiency     Patient Active Problem List   Diagnosis Date Noted   COVID-19 virus infection 02/04/2021   Acute bacterial rhinosinusitis 02/04/2021   Cough 02/04/2021   Chest tightness 02/04/2021   History of ovarian cyst 02/28/2020   Sciatica, left side 02/28/2020   History of COVID-19 02/28/2020   Overweight (BMI 25.0-29.9) 02/28/2020   Anxiety 02/28/2020    Past Surgical History:  Procedure Laterality Date   CYST EXCISION  2005   HEAD     OB History     Gravida  2   Para  2   Term      Preterm      AB      Living  2      SAB      IAB      Ectopic      Multiple      Live Births              Family History  Problem Relation Age of Onset   Heart disease Father 45   Hyperlipidemia Father    Hypertension Father    COPD Maternal Grandfather    Diabetes Mother    Heart attack Paternal Grandfather    Stroke Maternal Grandmother    Dementia Maternal Grandmother    Obesity Brother    Colon cancer Neg Hx    Esophageal cancer Neg Hx    Rectal cancer  Neg Hx    Stomach cancer Neg Hx     Social History   Tobacco Use   Smoking status: Never   Smokeless tobacco: Never  Vaping Use   Vaping Use: Never used  Substance Use Topics   Alcohol use: Yes   Drug use: No    Home Medications Prior to Admission medications   Medication Sig Start Date End Date Taking? Authorizing Provider  albuterol (VENTOLIN HFA) 108 (90 Base) MCG/ACT inhaler Inhale 2 puffs into the lungs every 6 (six) hours as needed for wheezing or shortness of breath. 02/04/21   Chalmers Guest, NP  ALPRAZolam Duanne Moron) 0.5 MG tablet Take 1 tablet (0.5 mg total) by mouth at bedtime as needed for sleep. Patient taking differently: Take 0.5 mg by mouth daily as needed for anxiety or sleep. 02/28/20   Erven Colla, DO  azithromycin (ZITHROMAX) 250 MG tablet Take 2 by mouth on day one, then one by mouth daily for 4 days 02/04/21   Chalmers Guest, NP  benzonatate (TESSALON) 100 MG capsule Take 1  capsule (100 mg total) by mouth 2 (two) times daily as needed for cough. 02/04/21   Chalmers Guest, NP  Cholecalciferol 50 MCG (2000 UT) CAPS Take 2,000 Units by mouth once a week.    [provider]  diclofenac (VOLTAREN) 75 MG EC tablet Take 1 tablet (75 mg total) by mouth 2 (two) times daily. Prn pain 07/23/20   Elvia Collum M, DO  diclofenac Sodium (VOLTAREN) 1 % GEL Apply 1 application topically 4 (four) times daily as needed (back pain.).    [provider]  ibuprofen (ADVIL) 200 MG tablet Take 400-800 mg by mouth every 8 (eight) hours as needed (pain/headaches.).    [provider]  levonorgestrel (MIRENA) 20 MCG/24HR IUD 1 each by Intrauterine route once.     [provider]  loratadine-pseudoephedrine (CLARITIN-D 24-HOUR) 10-240 MG 24 hr tablet Take 1 tablet by mouth daily.    [provider]  tiZANidine (ZANAFLEX) 4 MG tablet Take 1 tablet (4 mg total) by mouth 3 (three) times daily as needed for muscle spasms. 07/23/20   Erven Colla, DO     Allergies    Patient has no known allergies.  Review of Systems   Review of Systems  Constitutional:  Negative for chills, fatigue and fever.  Respiratory:  Negative for shortness of breath.   Cardiovascular:  Negative for chest pain and palpitations.  Gastrointestinal:  Positive for rectal pain. Negative for abdominal distention, abdominal pain, blood in stool, diarrhea, nausea and vomiting.  Genitourinary:  Negative for difficulty urinating, dysuria, flank pain, hematuria and vaginal bleeding.  Musculoskeletal:  Negative for arthralgias, back pain and myalgias.  Skin:  Negative for rash.  Neurological:  Negative for dizziness, syncope, weakness, numbness and headaches.  Hematological:  Does not bruise/bleed easily.   Physical Exam Updated Vital Signs BP (!) 179/96 (BP Location: Right Arm)   Pulse (!) 130   Temp 98.4 F (36.9 C) (Oral)   Resp 19   Ht '5\' 5"'$  (1.651 m)   Wt 78.9 kg   SpO2 100%   BMI 28.96 kg/m   Physical Exam Vitals and nursing note reviewed.  Constitutional:      General: She is not in acute distress.    Appearance: Normal appearance. She is not toxic-appearing.     Comments: Patient is somewhat anxious appearing.  HENT:     Head: Normocephalic.  Neck:     Thyroid: No thyromegaly.     Meningeal: Kernig's sign absent.  Cardiovascular:     Rate and Rhythm: Regular rhythm. Tachycardia present.     Pulses: Normal pulses.  Pulmonary:     Effort: Pulmonary effort is normal.     Breath sounds: Normal breath sounds. No wheezing.  Abdominal:     General: There is no distension.     Palpations: Abdomen is soft.     Tenderness: There is no abdominal tenderness. There is no guarding or rebound.  Genitourinary:    Rectum: Guaiac result negative. Anal fissure present. No external hemorrhoid. Normal anal tone.     Comments: Brown heme-negative stool.  No palpable rectal masses.  Patient does have an anal fissure.  No external hemorrhoids  noted. Musculoskeletal:        General: Normal range of motion.  Skin:    General: Skin is warm and dry.     Findings: No rash.  Neurological:     Mental Status: She is alert and oriented to person, place, and time.    ED  Results / Procedures / Treatments   Labs (all labs ordered are listed, but only abnormal results are displayed) Labs Reviewed  COMPREHENSIVE METABOLIC PANEL - Abnormal; Notable for the following components:      Result Value   Glucose, Bld 102 (*)    Calcium 8.8 (*)    All other components within normal limits  CBC WITH DIFFERENTIAL/PLATELET - Abnormal; Notable for the following components:   WBC 13.4 (*)    Neutro Abs 11.1 (*)    Abs Immature Granulocytes 0.09 (*)    All other components within normal limits  URINALYSIS, ROUTINE W REFLEX MICROSCOPIC    EKG None  Radiology No results found.  Procedures Procedures   Medications Ordered in ED Medications  sodium chloride 0.9 % bolus 1,000 mL (1,000 mLs Intravenous New Bag/Given 04/24/21 1858)  LORazepam (ATIVAN) injection 0.5 mg (0.5 mg Intravenous Given 04/24/21 1908)    ED Course  I have reviewed the triage vital signs and the nursing notes.  Pertinent labs & imaging results that were available during my care of the patient were reviewed by me and considered in my medical decision making (see chart for details).    MDM Rules/Calculators/A&P                           Patient here for evaluation of rectal pain x1 week secondary to heavy lifting.  Complains of a pressure fullness sensation of her rectum.  No increased pain with defecation.  Denies rectal bleeding or history of hemorrhoids. On exam, patient anxious appearing but nontoxic.  She has a soft nontender abdomen.  Heme-negative stool on DRE without palpable rectal masses.  She does have an anal fissure present.  I feel this is likely the source of patient's symptoms, but on arrival she is tachycardic at 130 and admits to decreased appetite.   Will obtain labs, CT abdomen and pelvis and administer IV fluids.  Patient also given anxiolytic.  Discussed findings with Deno Etienne, PA-C who agrees to review labs and CT findings and arrange disposition.  I have low clinical suspicion for acute abdominal/pelvic process.     Final Clinical Impression(s) / ED Diagnoses Final diagnoses:  Rectal pain  Anal fissure    Rx / DC Orders ED Discharge Orders     None        Bufford Lope 04/24/21 2004    Zammit, Joseph, MD 04/25/21 [phone]

## 2021-04-25 ENCOUNTER — Telehealth (HOSPITAL_COMMUNITY): Payer: Self-pay | Admitting: Emergency Medicine

## 2021-04-25 MED ORDER — HYDROCORTISONE (PERIANAL) 2.5 % EX CREA: 1.0000 "application " | TOPICAL_CREAM | Freq: Two times a day (BID) | CUTANEOUS | 0 refills | Status: DC

## 2021-04-25 NOTE — Telephone Encounter (Signed)
Patient seen here yesterday by me.  Received prescription for Rectiv, patient called back today stating that medication was $600 and requested something else be called in instead. Prescription written for Anusol HC cream.  Patient also has topical lidocaine prescription.

## 2021-04-26 ENCOUNTER — Encounter (HOSPITAL_COMMUNITY): Payer: Self-pay

## 2021-04-26 ENCOUNTER — Observation Stay (HOSPITAL_COMMUNITY)
Admission: EM | Admit: 2021-04-26 | Discharge: 2021-04-28 | Disposition: A | Discharge status: Home / Self Care | Payer: 59 | Attending: Surgery | Admitting: Surgery

## 2021-04-26 ENCOUNTER — Observation Stay (HOSPITAL_COMMUNITY): Payer: 59 | Admitting: Anesthesiology

## 2021-04-26 ENCOUNTER — Other Ambulatory Visit: Payer: Self-pay

## 2021-04-26 ENCOUNTER — Emergency Department (HOSPITAL_COMMUNITY): Payer: 59

## 2021-04-26 ENCOUNTER — Encounter (HOSPITAL_COMMUNITY)
Admission: EM | Disposition: A | Discharge status: Home / Self Care | Payer: Self-pay | Source: Home / Self Care | Attending: Emergency Medicine

## 2021-04-26 DIAGNOSIS — R102 Pelvic and perineal pain unspecified side: Secondary | ICD-10-CM

## 2021-04-26 DIAGNOSIS — K611 Rectal abscess: Principal | ICD-10-CM | POA: Insufficient documentation

## 2021-04-26 DIAGNOSIS — Z8249 Family history of ischemic heart disease and other diseases of the circulatory system: Secondary | ICD-10-CM

## 2021-04-26 DIAGNOSIS — Z975 Presence of (intrauterine) contraceptive device: Secondary | ICD-10-CM

## 2021-04-26 DIAGNOSIS — E559 Vitamin D deficiency, unspecified: Secondary | ICD-10-CM | POA: Diagnosis present

## 2021-04-26 DIAGNOSIS — Z20822 Contact with and (suspected) exposure to covid-19: Secondary | ICD-10-CM | POA: Insufficient documentation

## 2021-04-26 DIAGNOSIS — Z83438 Family history of other disorder of lipoprotein metabolism and other lipidemia: Secondary | ICD-10-CM

## 2021-04-26 DIAGNOSIS — Z825 Family history of asthma and other chronic lower respiratory diseases: Secondary | ICD-10-CM

## 2021-04-26 DIAGNOSIS — M5432 Sciatica, left side: Secondary | ICD-10-CM

## 2021-04-26 DIAGNOSIS — Z833 Family history of diabetes mellitus: Secondary | ICD-10-CM

## 2021-04-26 DIAGNOSIS — F419 Anxiety disorder, unspecified: Secondary | ICD-10-CM | POA: Diagnosis present

## 2021-04-26 DIAGNOSIS — Z6828 Body mass index (BMI) 28.0-28.9, adult: Secondary | ICD-10-CM

## 2021-04-26 DIAGNOSIS — D72829 Elevated white blood cell count, unspecified: Secondary | ICD-10-CM | POA: Diagnosis present

## 2021-04-26 DIAGNOSIS — Z823 Family history of stroke: Secondary | ICD-10-CM

## 2021-04-26 DIAGNOSIS — E663 Overweight: Secondary | ICD-10-CM | POA: Diagnosis present

## 2021-04-26 HISTORY — PX: INCISION AND DRAINAGE PERIRECTAL ABSCESS: SHX1804

## 2021-04-26 LAB — CBC
HCT: 47.1 % — ABNORMAL HIGH (ref 36.0–46.0)
Hemoglobin: 15.1 g/dL — ABNORMAL HIGH (ref 12.0–15.0)
MCH: 30.5 pg (ref 26.0–34.0)
MCHC: 32.1 g/dL (ref 30.0–36.0)
MCV: 95.2 fL (ref 80.0–100.0)
Platelets: 314 10*3/uL (ref 150–400)
RBC: 4.95 MIL/uL (ref 3.87–5.11)
RDW: 12.3 % (ref 11.5–15.5)
WBC: 14 10*3/uL — ABNORMAL HIGH (ref 4.0–10.5)
nRBC: 0 % (ref 0.0–0.2)

## 2021-04-26 LAB — URINALYSIS, ROUTINE W REFLEX MICROSCOPIC
Bilirubin Urine: NEGATIVE
Glucose, UA: NEGATIVE mg/dL
Hgb urine dipstick: NEGATIVE
Ketones, ur: 20 mg/dL — AB
Leukocytes,Ua: NEGATIVE
Nitrite: NEGATIVE
Protein, ur: NEGATIVE mg/dL
Specific Gravity, Urine: 1.015 (ref 1.005–1.030)
pH: 6 (ref 5.0–8.0)

## 2021-04-26 LAB — RESP PANEL BY RT-PCR (FLU A&B, COVID) ARPGX2
Influenza A by PCR: NEGATIVE
Influenza B by PCR: NEGATIVE
SARS Coronavirus 2 by RT PCR: NEGATIVE

## 2021-04-26 LAB — COMPREHENSIVE METABOLIC PANEL
ALT: 13 U/L (ref 0–44)
AST: 11 U/L — ABNORMAL LOW (ref 15–41)
Albumin: 3.6 g/dL (ref 3.5–5.0)
Alkaline Phosphatase: 46 U/L (ref 38–126)
Anion gap: 11 (ref 5–15)
BUN: 9 mg/dL (ref 6–20)
CO2: 19 mmol/L — ABNORMAL LOW (ref 22–32)
Calcium: 8.9 mg/dL (ref 8.9–10.3)
Chloride: 107 mmol/L (ref 98–111)
Creatinine, Ser: 0.79 mg/dL (ref 0.44–1.00)
GFR, Estimated: >60 mL/min (ref >60)
Glucose, Bld: 98 mg/dL (ref 70–99)
Potassium: 3.8 mmol/L (ref 3.5–5.1)
Sodium: 137 mmol/L (ref 135–145)
Total Bilirubin: 0.8 mg/dL (ref 0.3–1.2)
Total Protein: 6.9 g/dL (ref 6.5–8.1)

## 2021-04-26 LAB — WET PREP, GENITAL
Clue Cells Wet Prep HPF POC: NONE SEEN
Sperm: NONE SEEN
Trich, Wet Prep: NONE SEEN
Yeast Wet Prep HPF POC: NONE SEEN

## 2021-04-26 LAB — I-STAT BETA HCG BLOOD, ED (MC, WL, AP ONLY): I-stat hCG, quantitative: 10.7 m[IU]/mL — ABNORMAL HIGH (ref <5)

## 2021-04-26 LAB — LIPASE, BLOOD: Lipase: 20 U/L (ref 11–51)

## 2021-04-26 LAB — HIV ANTIBODY (ROUTINE TESTING W REFLEX): HIV Screen 4th Generation wRfx: NONREACTIVE

## 2021-04-26 SURGERY — INCISION AND DRAINAGE, ABSCESS, PERIRECTAL
Anesthesia: General | Site: Rectum

## 2021-04-26 MED ORDER — LIDOCAINE 2% (20 MG/ML) 5 ML SYRINGE
INTRAMUSCULAR | Status: AC
  Filled 2021-04-26: qty 5

## 2021-04-26 MED ORDER — LACTATED RINGERS IV SOLN: INTRAVENOUS | Status: DC

## 2021-04-26 MED ORDER — HYDROMORPHONE HCL 1 MG/ML IJ SOLN: 1.0000 mg | Freq: Once | INTRAMUSCULAR | Status: DC

## 2021-04-26 MED ORDER — FENTANYL CITRATE (PF) 100 MCG/2ML IJ SOLN
25.0000 ug | INTRAMUSCULAR | Status: DC | PRN
  Administered 2021-04-26 (×2): 25 ug via INTRAVENOUS

## 2021-04-26 MED ORDER — PHENYLEPHRINE HCL (PRESSORS) 10 MG/ML IV SOLN
INTRAVENOUS | Status: DC | PRN
  Administered 2021-04-26 (×2): 80 ug via INTRAVENOUS

## 2021-04-26 MED ORDER — PROPOFOL 10 MG/ML IV BOLUS
INTRAVENOUS | Status: AC
  Filled 2021-04-26: qty 20

## 2021-04-26 MED ORDER — ORAL CARE MOUTH RINSE: 15.0000 mL | Freq: Once | OROMUCOSAL | Status: DC

## 2021-04-26 MED ORDER — ENOXAPARIN SODIUM 40 MG/0.4ML IJ SOSY
40.0000 mg | PREFILLED_SYRINGE | INTRAMUSCULAR | Status: DC
  Administered 2021-04-27: 40 mg via SUBCUTANEOUS
  Filled 2021-04-26 (×2): qty 0.4

## 2021-04-26 MED ORDER — SUCCINYLCHOLINE CHLORIDE 200 MG/10ML IV SOSY
PREFILLED_SYRINGE | INTRAVENOUS | Status: AC
  Filled 2021-04-26: qty 10

## 2021-04-26 MED ORDER — ONDANSETRON HCL 4 MG/2ML IJ SOLN
INTRAMUSCULAR | Status: AC
  Filled 2021-04-26: qty 2

## 2021-04-26 MED ORDER — TRAMADOL HCL 50 MG PO TABS: 50.0000 mg | ORAL_TABLET | Freq: Four times a day (QID) | ORAL | Status: DC | PRN

## 2021-04-26 MED ORDER — EPHEDRINE 5 MG/ML INJ
INTRAVENOUS | Status: AC
  Filled 2021-04-26: qty 5

## 2021-04-26 MED ORDER — ONDANSETRON HCL 4 MG/2ML IJ SOLN: 4.0000 mg | Freq: Four times a day (QID) | INTRAMUSCULAR | Status: DC | PRN

## 2021-04-26 MED ORDER — HYDROXYZINE HCL 25 MG PO TABS
25.0000 mg | ORAL_TABLET | Freq: Once | ORAL | Status: AC
  Administered 2021-04-26: 25 mg via ORAL
  Filled 2021-04-26: qty 1

## 2021-04-26 MED ORDER — PIPERACILLIN-TAZOBACTAM 3.375 G IVPB
3.3750 g | Freq: Three times a day (TID) | INTRAVENOUS | Status: DC
  Administered 2021-04-27 – 2021-04-28 (×4): 3.375 g via INTRAVENOUS
  Filled 2021-04-26 (×6): qty 50

## 2021-04-26 MED ORDER — SODIUM CHLORIDE 0.9 % IV BOLUS
1000.0000 mL | Freq: Once | INTRAVENOUS | Status: AC
  Administered 2021-04-26: 1000 mL via INTRAVENOUS

## 2021-04-26 MED ORDER — OXYCODONE HCL 5 MG/5ML PO SOLN
5.0000 mg | Freq: Once | ORAL | Status: DC | PRN
Start: 2021-04-26 — End: 2021-04-26

## 2021-04-26 MED ORDER — 0.9 % SODIUM CHLORIDE (POUR BTL) OPTIME
TOPICAL | Status: DC | PRN
  Administered 2021-04-26: 1000 mL

## 2021-04-26 MED ORDER — CHLORHEXIDINE GLUCONATE 0.12 % MT SOLN
OROMUCOSAL | Status: AC
  Filled 2021-04-26: qty 15

## 2021-04-26 MED ORDER — LACTATED RINGERS IV SOLN: INTRAVENOUS | Status: DC | PRN

## 2021-04-26 MED ORDER — ONDANSETRON HCL 4 MG/2ML IJ SOLN
4.0000 mg | Freq: Four times a day (QID) | INTRAMUSCULAR | Status: DC | PRN
Start: 2021-04-26 — End: 2021-04-26

## 2021-04-26 MED ORDER — FENTANYL CITRATE (PF) 250 MCG/5ML IJ SOLN
INTRAMUSCULAR | Status: DC | PRN
  Administered 2021-04-26 (×2): 100 ug via INTRAVENOUS
  Administered 2021-04-26: 50 ug via INTRAVENOUS

## 2021-04-26 MED ORDER — CEFAZOLIN SODIUM-DEXTROSE 2-3 GM-%(50ML) IV SOLR
INTRAVENOUS | Status: DC | PRN
  Administered 2021-04-26: 2 g via INTRAVENOUS

## 2021-04-26 MED ORDER — FENTANYL CITRATE (PF) 100 MCG/2ML IJ SOLN
INTRAMUSCULAR | Status: AC
  Filled 2021-04-26: qty 2

## 2021-04-26 MED ORDER — ONDANSETRON 4 MG PO TBDP: 4.0000 mg | ORAL_TABLET | Freq: Four times a day (QID) | ORAL | Status: DC | PRN

## 2021-04-26 MED ORDER — PROPOFOL 10 MG/ML IV BOLUS
INTRAVENOUS | Status: DC | PRN
  Administered 2021-04-26: 160 mg via INTRAVENOUS

## 2021-04-26 MED ORDER — ONDANSETRON HCL 4 MG/2ML IJ SOLN
4.0000 mg | Freq: Once | INTRAMUSCULAR | Status: AC
  Administered 2021-04-26: 4 mg via INTRAVENOUS
  Filled 2021-04-26: qty 2

## 2021-04-26 MED ORDER — CHLORHEXIDINE GLUCONATE 0.12 % MT SOLN: 15.0000 mL | Freq: Once | OROMUCOSAL | Status: DC

## 2021-04-26 MED ORDER — ACETAMINOPHEN 500 MG PO TABS
1000.0000 mg | ORAL_TABLET | Freq: Once | ORAL | Status: AC
  Administered 2021-04-26: 1000 mg via ORAL
  Filled 2021-04-26: qty 2

## 2021-04-26 MED ORDER — MORPHINE SULFATE (PF) 4 MG/ML IV SOLN
4.0000 mg | Freq: Once | INTRAVENOUS | Status: AC
  Administered 2021-04-26: 4 mg via INTRAVENOUS
  Filled 2021-04-26: qty 1

## 2021-04-26 MED ORDER — MIDAZOLAM HCL 2 MG/2ML IJ SOLN
INTRAMUSCULAR | Status: AC
  Filled 2021-04-26: qty 2

## 2021-04-26 MED ORDER — OXYCODONE HCL 5 MG PO TABS
5.0000 mg | ORAL_TABLET | ORAL | Status: DC | PRN
  Administered 2021-04-26 – 2021-04-28 (×4): 10 mg via ORAL
  Filled 2021-04-26 (×4): qty 2

## 2021-04-26 MED ORDER — OXYCODONE HCL 5 MG PO TABS
5.0000 mg | ORAL_TABLET | Freq: Once | ORAL | Status: DC | PRN
Start: 2021-04-26 — End: 2021-04-26

## 2021-04-26 MED ORDER — DEXTROSE-NACL 5-0.9 % IV SOLN: INTRAVENOUS | Status: AC

## 2021-04-26 MED ORDER — IOHEXOL 300 MG/ML  SOLN
100.0000 mL | Freq: Once | INTRAMUSCULAR | Status: AC | PRN
  Administered 2021-04-26: 100 mL via INTRAVENOUS

## 2021-04-26 MED ORDER — FENTANYL CITRATE (PF) 250 MCG/5ML IJ SOLN
INTRAMUSCULAR | Status: AC
  Filled 2021-04-26: qty 5

## 2021-04-26 MED ORDER — PIPERACILLIN-TAZOBACTAM 3.375 G IVPB 30 MIN
3.3750 g | Freq: Once | INTRAVENOUS | Status: AC
  Administered 2021-04-26: 3.375 g via INTRAVENOUS
  Filled 2021-04-26: qty 50

## 2021-04-26 MED ORDER — SUGAMMADEX SODIUM 200 MG/2ML IV SOLN
INTRAVENOUS | Status: DC | PRN
  Administered 2021-04-26: 200 mg via INTRAVENOUS

## 2021-04-26 MED ORDER — LIDOCAINE HCL (CARDIAC) PF 100 MG/5ML IV SOSY
PREFILLED_SYRINGE | INTRAVENOUS | Status: DC | PRN
  Administered 2021-04-26: 40 mg via INTRATRACHEAL

## 2021-04-26 MED ORDER — OXYCODONE-ACETAMINOPHEN 5-325 MG PO TABS: 1.0000 | ORAL_TABLET | Freq: Four times a day (QID) | ORAL | 0 refills | Status: DC | PRN

## 2021-04-26 MED ORDER — MORPHINE SULFATE (PF) 4 MG/ML IV SOLN
4.0000 mg | Freq: Once | INTRAVENOUS | Status: AC
Start: 2021-04-26 — End: 2021-04-26
  Administered 2021-04-26: 4 mg via INTRAVENOUS
  Filled 2021-04-26: qty 1

## 2021-04-26 MED ORDER — ROCURONIUM BROMIDE 100 MG/10ML IV SOLN
INTRAVENOUS | Status: DC | PRN
  Administered 2021-04-26: 50 mg via INTRAVENOUS

## 2021-04-26 MED ORDER — MIDAZOLAM HCL 5 MG/5ML IJ SOLN
INTRAMUSCULAR | Status: DC | PRN
  Administered 2021-04-26: 2 mg via INTRAVENOUS

## 2021-04-26 MED ORDER — PHENYLEPHRINE 40 MCG/ML (10ML) SYRINGE FOR IV PUSH (FOR BLOOD PRESSURE SUPPORT)
PREFILLED_SYRINGE | INTRAVENOUS | Status: AC
  Filled 2021-04-26: qty 10

## 2021-04-26 MED ORDER — HYDROMORPHONE HCL 1 MG/ML IJ SOLN
2.0000 mg | Freq: Once | INTRAMUSCULAR | Status: AC
  Administered 2021-04-26: 2 mg via INTRAVENOUS
  Filled 2021-04-26: qty 2

## 2021-04-26 MED ORDER — OXYCODONE-ACETAMINOPHEN 5-325 MG PO TABS
1.0000 | ORAL_TABLET | Freq: Once | ORAL | Status: AC
  Administered 2021-04-26: 1 via ORAL
  Filled 2021-04-26: qty 1

## 2021-04-26 MED ORDER — ONDANSETRON HCL 4 MG/2ML IJ SOLN
INTRAMUSCULAR | Status: DC | PRN
  Administered 2021-04-26: 4 mg via INTRAVENOUS

## 2021-04-26 MED ORDER — HYDROMORPHONE HCL 1 MG/ML IJ SOLN: 1.0000 mg | INTRAMUSCULAR | Status: DC | PRN

## 2021-04-26 SURGICAL SUPPLY — 28 items
COVER SURGICAL LIGHT HANDLE (MISCELLANEOUS) ×2 IMPLANT
DRAPE UTILITY XL STRL (DRAPES) ×2 IMPLANT
DRSG PAD ABDOMINAL 8X10 ST (GAUZE/BANDAGES/DRESSINGS) ×3 IMPLANT
ELECT REM PT RETURN 9FT ADLT (ELECTROSURGICAL) ×2
ELECTRODE REM PT RTRN 9FT ADLT (ELECTROSURGICAL) ×1 IMPLANT
GAUZE PACKING IODOFORM 1X5 (PACKING) ×1 IMPLANT
GAUZE SPONGE 4X4 12PLY STRL (GAUZE/BANDAGES/DRESSINGS) ×2 IMPLANT
GLOVE SRG 8 PF TXTR STRL LF DI (GLOVE) ×1 IMPLANT
GLOVE SURG ENC MOIS LTX SZ7.5 (GLOVE) ×2 IMPLANT
GLOVE SURG UNDER POLY LF SZ8 (GLOVE) ×1
GOWN STRL REUS W/ TWL LRG LVL3 (GOWN DISPOSABLE) ×1 IMPLANT
GOWN STRL REUS W/ TWL XL LVL3 (GOWN DISPOSABLE) ×1 IMPLANT
GOWN STRL REUS W/TWL LRG LVL3 (GOWN DISPOSABLE) ×1
GOWN STRL REUS W/TWL XL LVL3 (GOWN DISPOSABLE) ×1
KIT BASIN OR (CUSTOM PROCEDURE TRAY) ×2 IMPLANT
KIT TURNOVER KIT B (KITS) ×2 IMPLANT
NS IRRIG 1000ML POUR BTL (IV SOLUTION) ×2 IMPLANT
PACK LITHOTOMY IV (CUSTOM PROCEDURE TRAY) ×2 IMPLANT
PAD ARMBOARD 7.5X6 YLW CONV (MISCELLANEOUS) ×2 IMPLANT
PENCIL SMOKE EVACUATOR (MISCELLANEOUS) ×2 IMPLANT
SPONGE T-LAP 18X18 ~~LOC~~+RFID (SPONGE) ×2 IMPLANT
SWAB COLLECTION DEVICE MRSA (MISCELLANEOUS) ×1 IMPLANT
SWAB CULTURE ESWAB REG 1ML (MISCELLANEOUS) IMPLANT
TOWEL GREEN STERILE (TOWEL DISPOSABLE) ×2 IMPLANT
TOWEL GREEN STERILE FF (TOWEL DISPOSABLE) ×2 IMPLANT
TUBE CONNECTING 12X1/4 (SUCTIONS) ×2 IMPLANT
UNDERPAD 30X36 HEAVY ABSORB (UNDERPADS AND DIAPERS) ×2 IMPLANT
YANKAUER SUCT BULB TIP NO VENT (SUCTIONS) ×2 IMPLANT

## 2021-04-26 NOTE — ED Provider Notes (Addendum)
[facility] EMERGENCY DEPARTMENT Provider Note   CSN: [id number] Arrival date & time: 04/26/21  0750     History No chief complaint on file.   Amanda Woodard is a 55 y.o. female.  HPI Patient is a 55 year old female with past medical history significant for ovarian cysts, overweight, anxiety  Patient presents to the ER today with now approximately 9 days of rectal pain seems to have worsened and progressed to left-sided vaginal/vulvar pain.  She denies any vaginal discharge or vaginal irritation.  She had a small anal fissure noted during her last ER visit and had a CT scan that was notable only for incidental findings.  She states she has no fevers chills cough congestion no abdominal pain and no pelvic pain she simply has severe perineal pain.  She denies any abnormal vaginal discharge.  No dyspareunia.  She is have been having sex since her symptoms of started.  No significant other associate symptoms.  No aggravating factors.     Past Medical History:  Diagnosis Date   Vitamin D deficiency     Patient Active Problem List   Diagnosis Date Noted   COVID-19 virus infection 02/04/2021   Acute bacterial rhinosinusitis 02/04/2021   Cough 02/04/2021   Chest tightness 02/04/2021   History of ovarian cyst 02/28/2020   Sciatica, left side 02/28/2020   History of COVID-19 02/28/2020   Overweight (BMI 25.0-29.9) 02/28/2020   Anxiety 02/28/2020    Past Surgical History:  Procedure Laterality Date   CYST EXCISION  2005   HEAD     OB History     Gravida  2   Para  2   Term      Preterm      AB      Living  2      SAB      IAB      Ectopic      Multiple      Live Births              Family History  Problem Relation Age of Onset   Heart disease Father 71   Hyperlipidemia Father    Hypertension Father    COPD Maternal Grandfather    Diabetes Mother    Heart attack Paternal Grandfather    Stroke Maternal Grandmother     Dementia Maternal Grandmother    Obesity Brother    Colon cancer Neg Hx    Esophageal cancer Neg Hx    Rectal cancer Neg Hx    Stomach cancer Neg Hx     Social History   Tobacco Use   Smoking status: Never   Smokeless tobacco: Never  Vaping Use   Vaping Use: Never used  Substance Use Topics   Alcohol use: Yes   Drug use: No    Home Medications Prior to Admission medications   Medication Sig Start Date End Date Taking? Authorizing Provider  acetaminophen (TYLENOL) 500 MG tablet Take 1,000 mg by mouth every 8 (eight) hours as needed (pain).   Yes [provider]  ALPRAZolam (XANAX) 0.5 MG tablet Take 1 tablet (0.5 mg total) by mouth at bedtime as needed for sleep. Patient taking differently: Take 0.5 mg by mouth at bedtime as needed for anxiety or sleep. 02/28/20  Yes Taylor, Malena M, DO  Bioflavonoid Products (VITAMIN C) CHEW Chew 1 tablet by mouth daily.   Yes [provider]  Cholecalciferol (VITAMIN D3 PO) Take 1 tablet by mouth daily as needed (immune  system boost).   Yes [provider]  diclofenac (VOLTAREN) 75 MG EC tablet Take 1 tablet (75 mg total) by mouth 2 (two) times daily. Prn pain Patient taking differently: Take 75 mg by mouth 2 (two) times daily as needed (pain). 07/23/20  Yes Lovena Le, Malena M, DO  diclofenac Sodium (VOLTAREN) 1 % GEL Apply 1 application topically 4 (four) times daily as needed (pain).   Yes [provider]  FEXOFENADINE HCL PO Take 1 tablet by mouth daily as needed (congestion/allergies).   Yes [provider]  hydrocortisone (ANUSOL-HC) 2.5 % rectal cream Place 1 application rectally 2 (two) times daily. 04/25/21  Yes Triplett, Tammy, PA-C  ibuprofen (ADVIL) 200 MG tablet Take 800 mg by mouth every 8 (eight) hours as needed (pain/headaches.).   Yes [provider]  levonorgestrel (MIRENA) 20 MCG/24HR IUD 1 each by Intrauterine route once. Implanted summer 2021   Yes [provider]   Lidocaine, Anorectal, 5 % CREA Apply 1 application topically 3 (three) times daily as needed. Affected area as needed Patient taking differently: Apply 1 application topically 3 (three) times daily as needed (pain). 04/24/21  Yes Marcello Fennel, PA-C  Multiple Vitamins-Minerals (ZINC PO) Take 1 tablet by mouth daily as needed (immune system boost).   Yes [provider]  oxyCODONE-acetaminophen (PERCOCET/ROXICET) 5-325 MG tablet Take 1 tablet by mouth every 6 (six) hours as needed for severe pain. 04/26/21  Yes Shamia Uppal, Ova [place] S, PA  Probiotic CHEW Chew 1 tablet by mouth daily.   Yes [provider]  tiZANidine (ZANAFLEX) 4 MG tablet Take 1 tablet (4 mg total) by mouth 3 (three) times daily as needed for muscle spasms. 07/23/20  Yes Lovena Le, Malena M, DO  Nitroglycerin (RECTIV) 0.4 % OINT Place 1 inch rectally daily for 10 days. Patient not taking: No sig reported 04/24/21 05/04/21  Marcello Fennel, PA-C    Allergies    Patient has no known allergies.  Review of Systems   Review of Systems  Constitutional:  Negative for chills and fever.  HENT:  Negative for congestion and ear pain.   Eyes:  Negative for pain.  Respiratory:  Negative for cough and shortness of breath.   Cardiovascular:  Negative for chest pain.  Gastrointestinal:  Negative for abdominal pain, blood in stool, constipation, diarrhea, nausea and vomiting.  Endocrine: Negative for polydipsia and polyuria.  Genitourinary:  Positive for pelvic pain. Negative for difficulty urinating, dysuria, hematuria, vaginal discharge and vaginal pain.  Musculoskeletal:  Negative for myalgias.  Neurological:  Negative for dizziness and headaches.   Physical Exam Updated Vital Signs BP 130/81   Pulse (!) 113   Temp 98.7 F (37.1 C) (Oral)   Resp (!) 23   SpO2 100%   Physical Exam Vitals and nursing note reviewed.  Constitutional:      General: She is in acute distress.  HENT:     Head: Normocephalic and  atraumatic.     Nose: Nose normal.  Eyes:     General: No scleral icterus. Cardiovascular:     Rate and Rhythm: Normal rate and regular rhythm.     Pulses: Normal pulses.     Heart sounds: Normal heart sounds.  Pulmonary:     Effort: Pulmonary effort is normal. No respiratory distress.     Breath sounds: No wheezing.  Abdominal:     Palpations: Abdomen is soft.     Tenderness: There is no abdominal tenderness. There is no guarding or rebound.  Genitourinary:  Comments: Left labia is significantly swollen there is induration tracks down into the gluteus on the left side. Vaginal canal without abnormal discharge or lesion--there is some scant discharge however Cervix appears normal, is closed.  Strings present. No adnexal tenderness or CMT   Musculoskeletal:     Cervical back: Normal range of motion.     Right lower leg: No edema.     Left lower leg: No edema.  Skin:    General: Skin is warm and dry.     Capillary Refill: Capillary refill takes less than 2 seconds.  Neurological:     Mental Status: She is alert. Mental status is at baseline.  Psychiatric:        Mood and Affect: Mood normal.        Behavior: Behavior normal.    ED Results / Procedures / Treatments   Labs (all labs ordered are listed, but only abnormal results are displayed) Labs Reviewed  WET PREP, GENITAL - Abnormal; Notable for the following components:      Result Value   WBC, Wet Prep HPF POC MODERATE (*)    All other components within normal limits  COMPREHENSIVE METABOLIC PANEL - Abnormal; Notable for the following components:   CO2 19 (*)    AST 11 (*)    All other components within normal limits  CBC - Abnormal; Notable for the following components:   WBC 14.0 (*)    Hemoglobin 15.1 (*)    HCT 47.1 (*)    All other components within normal limits  URINALYSIS, ROUTINE W REFLEX MICROSCOPIC - Abnormal; Notable for the following components:   APPearance HAZY (*)    Ketones, ur 20 (*)    All  other components within normal limits  I-STAT BETA HCG BLOOD, ED ([facility], [facility], [facility] ONLY) - Abnormal; Notable for the following components:   I-stat hCG, quantitative 10.7 (*)    All other components within normal limits  LIPASE, BLOOD  RPR  HIV ANTIBODY (ROUTINE TESTING W REFLEX)  POC OCCULT BLOOD, ED  GC/CHLAMYDIA PROBE AMP ([place]) NOT AT [facility]    EKG None  Radiology CT ABDOMEN PELVIS W CONTRAST  Result Date: 04/24/2021 CLINICAL DATA:  Rectal pain for 1 week following heavy lifting, initial encounter EXAM: CT ABDOMEN AND PELVIS WITH CONTRAST TECHNIQUE: Multidetector CT imaging of the abdomen and pelvis was performed using the standard protocol following bolus administration of intravenous contrast. CONTRAST:  188m OMNIPAQUE IOHEXOL 300 MG/ML  SOLN COMPARISON:  None. FINDINGS: Lower chest: Mild dependent atelectatic changes are noted. Hepatobiliary: No focal liver abnormality is seen. No gallstones, gallbladder wall thickening, or biliary dilatation. Pancreas: Unremarkable. No pancreatic ductal dilatation or surrounding inflammatory changes. Spleen: Spleen shows a large inferior cyst measuring 5.2 cm. This appears benign in etiology. Adrenals/Urinary Tract: Adrenal glands are unremarkable. Kidneys demonstrate a normal enhancement pattern bilaterally. No definitive renal calculi are seen. Bladder is partially distended. The ureters are within normal limits. Stomach/Bowel: Small hiatal hernia is noted. Stomach is otherwise within normal limits. Scattered diverticular change of the colon is noted without diverticulitis. The appendix is not well visualized. No inflammatory changes are identified to suggest appendicitis. Small bowel is within normal limits. Vascular/Lymphatic: No significant vascular findings are present. No enlarged abdominal or pelvic lymph nodes. Reproductive: Uterus is within normal limits. Left adnexa is unremarkable. A 2.9 cm simple cyst is noted within the right adnexa. IUD is  noted in place. Other: No abdominal wall hernia or abnormality. No abdominopelvic ascites. Musculoskeletal:  No acute or significant osseous findings. IMPRESSION: Diverticulosis without diverticulitis. Small hiatal hernia. Large benign appearing splenic cyst. 2.9 cm simple cyst in the right ovary No follow-up imaging recommended. Note: This recommendation does not apply to premenarchal patients and to those with increased risk (genetic, family history, elevated tumor markers or other high-risk factors) of ovarian cancer. Reference: JACR 2020 Feb; 17(2):248-254 No other focal abnormality is noted. Electronically Signed   By: Inez Catalina M.D.   On: 04/24/2021 20:36    Procedures Procedures   Medications Ordered in ED Medications  morphine 4 MG/ML injection 4 mg (has no administration in time range)  acetaminophen (TYLENOL) tablet 1,000 mg (has no administration in time range)  oxyCODONE-acetaminophen (PERCOCET/ROXICET) 5-325 MG per tablet 1 tablet (1 tablet Oral Given 04/26/21 0932)  morphine 4 MG/ML injection 4 mg (4 mg Intravenous Given 04/26/21 1354)  sodium chloride 0.9 % bolus 1,000 mL (0 mLs Intravenous Stopped 04/26/21 1454)  hydrOXYzine (ATARAX/VISTARIL) tablet 25 mg (25 mg Oral Given 04/26/21 1356)    ED Course  I have reviewed the triage vital signs and the nursing notes.  Pertinent labs & imaging results that were available during my care of the patient were reviewed by me and considered in my medical decision making (see chart for details).    MDM Rules/Calculators/A&P                           Patient is a 55 year old female with past medical history detailed in HPI she presented today with continued perineal pain seems that she was seen 2 days ago had a CT scan of the abdomen pelvis that was reassuringly with only incidental findings.  Her pain became worse she presents to the ER today with leukocytosis notable discomfort mildly tachycardic CMP unremarkable apart from mildly low bicarb  lipase within normal limits hCG only mildly elevated with prep unremarkable.  Extremity pending.  Physical exam notable for some mild vaginal discharge but she states that her vaginal discharge is not particularly abnormal.  Pain somewhat improved with morphine will be dose.  Noted that CT abdomen pelvis done 2 days ago did not include slices of the gluteal and vulvar area where she is having pain.  Will reimage with CT pelvis with contrast discussed with technician who states that they will scan past gluteus.  Will redose morphine and provide with Tylenol given patient's continued pain. Discussed with Marcene Brawn who will follow up on CT study and disposition appropriately patient may require surgery versus bedside I&D versus antibiotics alone.  Final Clinical Impression(s) / ED Diagnoses Final diagnoses:  Perineal pain    Rx / DC Orders ED Discharge Orders          Ordered    oxyCODONE-acetaminophen (PERCOCET/ROXICET) 5-325 MG tablet  Every 6 hours PRN        04/26/21 1542             Tedd Sias, [place] 04/26/21 1549    Tedd Sias, [place] [id number]    Gray, [place] P, DO [phone]

## 2021-04-26 NOTE — ED Provider Notes (Signed)
[name]  Ct shows a 3x4 cm perianal abscess.   Surgery consulted. Dr. Rosendo Gros will see here for evaluation.    Amanda Woodard 04/26/21 [name]    [name], MD 04/26/21 2322

## 2021-04-26 NOTE — Progress Notes (Signed)
Pharmacy Antibiotic Note  Amanda Woodard is a 55 y.o. female admitted on 04/26/2021 with perirectal abscess; pt is S/P I&D of abscess this evening.  Pharmacy has been consulted for Zosyn dosing for wound infection.  WBC 14, Tmax 98.8; Scr 0.79, CrCl 85.4 ml/min  Plan: Zosyn 3.375 gm IV Q 8 hrs (extended infusion) Monitor WBC, temp, clinical improvement, cultures, renal function   Temp (24hrs), Avg:98.2 F (36.8 C), Min:97 F (36.1 C), Max:98.8 F (37.1 C)  Recent Labs  Lab 04/24/21 1905 04/26/21 0837  WBC 13.4* 14.0*  CREATININE 0.85 0.79    Estimated Creatinine Clearance: 85.4 mL/min (by C-G formula based on SCr of 0.79 mg/dL).    No Known Allergies  Antimicrobials this admission: Cefazolin X 1 8/8 Zosyn 8/8 >>  Microbiology results: 8/8 COVID, flu A, flu B:  negative 8/8 Wet prep: no yeast, trich,clue cells, mod WBC 8/8 Surgical cx: pending  Thank you for allowing pharmacy to be a part of this patient's care.  Gillermina Hu, PharmD, BCPS, [facility] Clinical Pharmacist 04/26/2021 9:23 PM

## 2021-04-26 NOTE — Progress Notes (Signed)
Urine preg test not needed per Dr Marcie Bal. See results for serum quantitative. No further orders.

## 2021-04-26 NOTE — Anesthesia Procedure Notes (Signed)
Procedure Name: Intubation Date/Time: 04/26/2021 8:52 PM Performed by: Clovis Cao, CRNA Pre-anesthesia Checklist: Patient identified, Emergency Drugs available, Suction available and Patient being monitored Patient Re-evaluated:Patient Re-evaluated prior to induction Oxygen Delivery Method: Circle system utilized Preoxygenation: Pre-oxygenation with 100% oxygen Induction Type: IV induction Ventilation: Mask ventilation without difficulty Laryngoscope Size: Miller and 2 Grade View: Grade I Tube type: Oral Tube size: 7.0 mm Number of attempts: 1 Airway Equipment and Method: Stylet Placement Confirmation: ETT inserted through vocal cords under direct vision, positive ETCO2 and breath sounds checked- equal and bilateral Secured at: 20 cm Tube secured with: Tape Dental Injury: Teeth and Oropharynx as per pre-operative assessment

## 2021-04-26 NOTE — Transfer of Care (Signed)
Immediate Anesthesia Transfer of Care Note  Patient: Amanda Woodard  Procedure(s) Performed: IRRIGATION AND DEBRIDEMENT PERIRECTAL ABSCESS (Rectum)  Patient Location: PACU  Anesthesia Type:General  Level of Consciousness: drowsy  Airway & Oxygen Therapy: Patient Spontanous Breathing  Post-op Assessment: Report given to RN and Post -op Vital signs reviewed and stable  Post vital signs: Reviewed and stable  Last Vitals:  Vitals Value Taken Time  BP 100/57 04/26/21 2118  Temp 36.1 C 04/26/21 2115  Pulse 106 04/26/21 2125  Resp 9 04/26/21 2125  SpO2 100 % 04/26/21 2125  Vitals shown include unvalidated device data.  Last Pain:  Vitals:   04/26/21 2115  TempSrc:   PainSc: Asleep         Complications: No notable events documented.

## 2021-04-26 NOTE — Discharge Instructions (Addendum)
Please use Tylenol or ibuprofen for pain.  You may use 600 mg ibuprofen every 6 hours or [phone] mg of Tylenol every 6 hours.  You may choose to alternate between the 2.  This would be most effective.  Not to exceed 4 g of Tylenol within 24 hours.  Not to exceed 3200 mg ibuprofen 24 hours.   [facility] _______[facility]  RECTAL SURGERY POST OP INSTRUCTIONS: POST OP INSTRUCTIONS  Always review your discharge instruction sheet given to you by the facility where your surgery was performed. IF YOU HAVE DISABILITY OR FAMILY LEAVE FORMS, YOU MUST BRING THEM TO THE OFFICE FOR PROCESSING.   DO NOT GIVE THEM TO YOUR DOCTOR.  A  prescription for pain medication may be given to you upon discharge.  Take your pain medication as prescribed, if needed.  If narcotic pain medicine is not needed, then you may take acetaminophen (Tylenol) or ibuprofen (Advil) as needed. Take your usually prescribed medications unless otherwise directed. If you need a refill on your pain medication, please contact your pharmacy.  They will contact our office to request authorization. Prescriptions will not be filled after 5 pm or on week-ends. You should follow a light diet the first 48 hours after arrival home, such as soup and crackers, etc.  Be sure to include lots of fluids daily.  Resume your normal diet 2-3 days after surgery.. Most patients will experience some swelling and discomfort in the rectal area. Ice packs, reclining and warm tub soaks will help.  Swelling and discomfort can take several days to resolve.  It is common to experience some constipation if taking pain medication after surgery.  Increasing fluid intake and taking a stool softener (such as Colace) will usually help or prevent this problem from occurring.  A mild laxative (Milk of Magnesia or Miralax) should be taken according to package directions if there are no bowel movements after 48 hours. Unless discharge instructions indicate  otherwise, leave your bandage dry and in place for 24 hours, or remove the bandage if you have a bowel movement. You may notice a small amount of bleeding with bowel movements for the first few days. You may have some packing in the rectum which will come out over the first day or two. You will need to wear an absorbent pad or soft cotton gauze in your underwear until the drainage stops.it. ACTIVITIES:  You may resume regular (light) daily activities beginning the next day--such as daily self-care, walking, climbing stairs--gradually increasing activities as tolerated.  You may have sexual intercourse when it is comfortable.  Refrain from any heavy lifting or straining until approved by your doctor. You may drive when you are no longer taking prescription pain medication, you can comfortably wear a seatbelt, and you can safely maneuver your car and apply brakes. RETURN TO WORK: : ____________________  Dennis Bast should see your doctor in the office for a follow-up appointment approximately 2-3 weeks after your surgery.  Make sure that you call for this appointment within a day or two after you arrive home to insure a convenient appointment time. OTHER INSTRUCTIONS:  __________________________________________________________________________________________________________________________________________________________________________________________  WHEN TO CALL YOUR DOCTOR: Fever over 101.0 Inability to urinate Nausea and/or vomiting Extreme swelling or bruising Continued bleeding from rectum. Increased pain, redness, or drainage from the incision Constipation  The clinic staff is available to answer your questions during regular business hours.  Please don't hesitate to call and ask to speak to one of the  nurses for clinical concerns.  If you have a medical emergency, go to the nearest emergency room or call 911.  A surgeon from [facility] is always on call at the hospital   [address], [place], [place], [place]  [address] ?  [address], [place], [place]   [address] [phone] ? [phone] ? FAX [phone] Web site: [email or web address]   GETTING TO GOOD BOWEL HEALTH. Irregular bowel habits such as constipation and diarrhea can lead to many problems over time.  Having one soft bowel movement a day is the most important way to prevent further problems.  The anorectal canal is designed to handle stretching and feces to safely manage our ability to get rid of solid waste (feces, poop, stool) out of our body.  BUT, hard constipated stools can act like ripping concrete bricks and diarrhea can be a burning fire to this very sensitive area of our body, causing inflamed hemorrhoids, anal fissures, increasing risk is perirectal abscesses, abdominal pain/bloating, an making irritable bowel worse.     The goal: ONE SOFT BOWEL MOVEMENT A DAY!  To have soft, regular bowel movements:  Drink at least 8 tall glasses of water a day.   Take plenty of fiber.  Fiber is the undigested part of plant food that passes into the colon, acting s "natures broom" to encourage bowel motility and movement.  Fiber can absorb and hold large amounts of water. This results in a larger, bulkier stool, which is soft and easier to pass. Work gradually over several weeks up to 6 servings a day of fiber (25g a day even more if needed) in the form of: Vegetables -- Root (potatoes, carrots, turnips), leafy green (lettuce, salad greens, celery, spinach), or cooked high residue (cabbage, broccoli, etc) Fruit -- Fresh (unpeeled skin & pulp), Dried (prunes, apricots, cherries, etc ),  or stewed ( applesauce)  Whole grain breads, pasta, etc (whole wheat)  Bran cereals  Bulking Agents -- This type of water-retaining fiber generally is easily obtained each day by one of the following:  Psyllium bran -- The psyllium plant is remarkable because its ground seeds can retain so much water. This product is  available as Metamucil, Konsyl, Effersyllium, Per Diem Fiber, or the less expensive generic preparation in drug and health food stores. Although labeled a laxative, it really is not a laxative.  Methylcellulose -- This is another fiber derived from wood which also retains water. It is available as Citrucel. Polyethylene Glycol - and "artificial" fiber commonly called Miralax or Glycolax.  It is helpful for people with gassy or bloated feelings with regular fiber Flax Seed - a less gassy fiber than psyllium No reading or other relaxing activity while on the toilet. If bowel movements take longer than 5 minutes, you are too constipated AVOID CONSTIPATION.  High fiber and water intake usually takes care of this.  Sometimes a laxative is needed to stimulate more frequent bowel movements, but  Laxatives are not a good long-term solution as it can wear the colon out. Osmotics (Milk of Magnesia, Fleets phosphosoda, Magnesium citrate, MiraLax, GoLytely) are safer than  Stimulants (Senokot, Castor Oil, Dulcolax, Ex Lax)    Do not take laxatives for more than 7days in a row.  IF SEVERELY CONSTIPATED, try a Bowel Retraining Program: Do not use laxatives.  Eat a diet high in roughage, such as bran cereals and leafy vegetables.  Drink six (6) ounces of prune or apricot juice each morning.  Eat  two (2) large servings of stewed fruit each day.  Take one (1) heaping tablespoon of a psyllium-based bulking agent twice a day. Use sugar-free sweetener when possible to avoid excessive calories.  Eat a normal breakfast.  Set aside 15 minutes after breakfast to sit on the toilet, but do not strain to have a bowel movement.  If you do not have a bowel movement by the third day, use an enema and repeat the above steps.  Controlling diarrhea Switch to liquids and simpler foods for a few days to avoid stressing your intestines further. Avoid dairy products (especially milk & ice cream) for a short time.  The intestines  often can lose the ability to digest lactose when stressed. Avoid foods that cause gassiness or bloating.  Typical foods include beans and other legumes, cabbage, broccoli, and dairy foods.  Every person has some sensitivity to other foods, so listen to our body and avoid those foods that trigger problems for you. Adding fiber (Citrucel, Metamucil, psyllium, Miralax) gradually can help thicken stools by absorbing excess fluid and retrain the intestines to act more normally.  Slowly increase the dose over a few weeks.  Too much fiber too soon can backfire and cause cramping & bloating. Probiotics (such as active yogurt, Align, etc) may help repopulate the intestines and colon with normal bacteria and calm down a sensitive digestive tract.  Most studies show it to be of mild help, though, and such products can be costly. Medicines: Bismuth subsalicylate (ex. Kayopectate, Pepto Bismol) every 30 minutes for up to 6 doses can help control diarrhea.  Avoid if pregnant. Loperamide (Immodium) can slow down diarrhea.  Start with two tablets ('4mg'$  total) first and then try one tablet every 6 hours.  Avoid if you are having fevers or severe pain.  If you are not better or start feeling worse, stop all medicines and call your doctor for advice Call your doctor if you are getting worse or not better.  Sometimes further testing (cultures, endoscopy, X-ray studies, bloodwork, etc) may be needed to help diagnose and treat the cause of the diarrhea.

## 2021-04-26 NOTE — Op Note (Signed)
04/26/2021  9:09 PM  PATIENT:  Amanda Woodard  55 y.o. female  PRE-OPERATIVE DIAGNOSIS:  Perirectal Abscess  POST-OPERATIVE DIAGNOSIS: Same  PROCEDURE:  Procedure(s): IRRIGATION AND DEBRIDEMENT PERIRECTAL ABSCESS (N/A)  SURGEON:  Surgeon(s) and Role:    Ralene Ok, MD - Primary   ASSISTANTS: none   ANESTHESIA:   general  EBL:  50 mL   BLOOD ADMINISTERED:none  DRAINS: 1 inch iodoform gauze within the abscess cavity   LOCAL MEDICATIONS USED:  NONE  SPECIMEN:  Source of Specimen: Perirectal abscess  DISPOSITION OF SPECIMEN: Microbiology  COUNTS:  YES  TOURNIQUET:  * No tourniquets in log *  DICTATION: .Dragon Dictation Indication procedure: Patient is a 55 year old female who comes in secondary to a 3 to 4-day history of a perirectal abscess. Patient went CT scan was found to have a large perirectal abscess.  Patient was taken back emergently for I&D of abscess. Findings: Patient had a approximately 5 to 6 cm perirectal abscess.  This was anterior to the rectum in the perineum.  This did not connect anteriorly.  1 inch iodoform gauze was placed into the wound.  Both aerobic and anaerobic cultures were obtained.  Details of procedure: After the patient was consented she was taken back to the OR and placed in supine position with bilateral SCDs in place.  Patient underwent general endotracheal intubation.  Patient was then prepped and draped in standard fashion.  A timeout was called all facts verified.  The area of greatest fluctuance was incised with #10 blade.  Large amount of foul-smelling purulence was expressed.  Aerobic and anaerobic cultures were obtained.  At this time the incision was extended anteriorly.  A hemostat was used to break up all loculations in the abscess cavity.  The abscess cavity was palpated and seen to be anterior to the rectum.  There appeared to be no gross communication with the rectum.  At this time the area was irrigated out with sterile  saline.  The abscess cavity appeared to be approximately 5 x 6 cm.  At this time the area was packed with 1 inch iodoform gauze.  The wound was dressed with 4 x 4's, and ABD pad, and mesh underwear.  Patient taught the procedure well was taken to the recovery in stable condition.   PLAN OF CARE: Admit to inpatient   PATIENT DISPOSITION:  PACU - hemodynamically stable.   Delay start of Pharmacological VTE agent (>24hrs) due to surgical blood loss or risk of bleeding: yes

## 2021-04-26 NOTE — ED Triage Notes (Signed)
Pt here with complaints vaginal pain and unable to have bowel movement. Pt endorses vaginal swelling, and rectal swelling. Seen for same at [facility]. Symptoms worsen over weekend. Pain 10/10.

## 2021-04-26 NOTE — ED Notes (Signed)
Patient transported to CT 

## 2021-04-26 NOTE — ED Notes (Signed)
Pt dropped sats to 898% while resting post medication. Placed on 2L for support.

## 2021-04-26 NOTE — Anesthesia Preprocedure Evaluation (Signed)
Anesthesia Evaluation  Patient identified by MRN, date of birth, ID band Patient awake    Reviewed: Allergy & Precautions, H&P , NPO status , Patient's Chart, lab work & pertinent test results  Airway Mallampati: II   Neck ROM: full    Dental   Pulmonary neg pulmonary ROS,    breath sounds clear to auscultation       Cardiovascular negative cardio ROS   Rhythm:regular Rate:Normal     Neuro/Psych PSYCHIATRIC DISORDERS Anxiety    GI/Hepatic Perirectal abscess   Endo/Other    Renal/GU      Musculoskeletal   Abdominal   Peds  Hematology   Anesthesia Other Findings   Reproductive/Obstetrics                             Anesthesia Physical Anesthesia Plan  ASA: 2  Anesthesia Plan: General   Post-op Pain Management:    Induction: Intravenous  PONV Risk Score and Plan: 3 and Ondansetron, Dexamethasone, Midazolam and Treatment may vary due to age or medical condition  Airway Management Planned: Oral ETT  Additional Equipment:   Intra-op Plan:   Post-operative Plan: Extubation in OR  Informed Consent: I have reviewed the patients History and Physical, chart, labs and discussed the procedure including the risks, benefits and alternatives for the proposed anesthesia with the patient or authorized representative who has indicated his/her understanding and acceptance.     Dental advisory given  Plan Discussed with: CRNA, Anesthesiologist and Surgeon  Anesthesia Plan Comments:         Anesthesia Quick Evaluation

## 2021-04-26 NOTE — H&P (Signed)
Amanda Woodard is an 55 y.o. female.   Chief Complaint: Perirectal pain HPI: Patient is a 55 year old female, comes in secondary perirectal pain.  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  She thought this was a hemorrhoid.  She tried hemorrhoid cream however this was unsuccessful.  Patient ultimately continued to progress it was significant.  She states that she had pain with bowel movements.  Patient denies any fevers or chills.  Patient denies any blood per rectum.  Patient underwent CT scan in the ER.  Patient was found to have a large perianal abscess.  Patient with leukocytosis.  I did review the CT scan and labs personally.  General surgery was consulted for further evaluation and management.  Past Medical History:  Diagnosis Date   Vitamin D deficiency     Past Surgical History:  Procedure Laterality Date   CYST EXCISION  2005   HEAD    Family History  Problem Relation Age of Onset   Heart disease Father 8   Hyperlipidemia Father    Hypertension Father    COPD Maternal Grandfather    Diabetes Mother    Heart attack Paternal Grandfather    Stroke Maternal Grandmother    Dementia Maternal Grandmother    Obesity Brother    Colon cancer Neg Hx    Esophageal cancer Neg Hx    Rectal cancer Neg Hx    Stomach cancer Neg Hx    Social History:  reports that she has never smoked. She has never used smokeless tobacco. She reports current alcohol use. She reports that she does not use drugs.  Allergies: No Known Allergies  (Not in a hospital admission)   Results for orders placed or performed during the hospital encounter of 04/26/21 (from the past 48 hour(s))  Urinalysis, Routine w reflex microscopic     Status: Abnormal   Collection Time: 04/26/21  8:18 AM  Result Value Ref Range   Color, Urine YELLOW YELLOW   APPearance HAZY (A) CLEAR   Specific Gravity, Urine 1.015 1.005 - 1.030   pH 6.0  5.0 - 8.0   Glucose, UA NEGATIVE NEGATIVE mg/dL   Hgb urine dipstick NEGATIVE NEGATIVE   Bilirubin Urine NEGATIVE NEGATIVE   Ketones, ur 20 (A) NEGATIVE mg/dL   Protein, ur NEGATIVE NEGATIVE mg/dL   Nitrite NEGATIVE NEGATIVE   Leukocytes,Ua NEGATIVE NEGATIVE    Comment: Performed at [facility] [address] [place], [place] [address]  Lipase, blood     Status: None   Collection Time: 04/26/21  8:37 AM  Result Value Ref Range   Lipase 20 11 - 51 U/L    Comment: Performed at [facility] [address], [place], [place] [address]  Comprehensive metabolic panel     Status: Abnormal   Collection Time: 04/26/21  8:37 AM  Result Value Ref Range   Sodium 137 135 - 145 mmol/L   Potassium 3.8 3.5 - 5.1 mmol/L   Chloride 107 98 - 111 mmol/L   CO2 19 (L) 22 - 32 mmol/L   Glucose, Bld 98 70 - 99 mg/dL    Comment: Glucose reference range applies only to samples taken after fasting for at least 8 hours.   BUN 9 6 - 20 mg/dL   Creatinine, Ser 0.79 0.44 - 1.00 mg/dL   Calcium 8.9 8.9 - 10.3 mg/dL   Total Protein 6.9 6.5 - 8.1 g/dL  Albumin 3.6 3.5 - 5.0 g/dL   AST 11 (L) 15 - 41 U/L   ALT 13 0 - 44 U/L   Alkaline Phosphatase 46 38 - 126 U/L   Total Bilirubin 0.8 0.3 - 1.2 mg/dL   GFR, Estimated >60 >60 mL/min    Comment: (NOTE) Calculated using the CKD-EPI Creatinine Equation (2021)    Anion gap 11 5 - 15    Comment: Performed at [facility] [address], [place], [place] [address]  CBC     Status: Abnormal   Collection Time: 04/26/21  8:37 AM  Result Value Ref Range   WBC 14.0 (H) 4.0 - 10.5 K/uL   RBC 4.95 3.87 - 5.11 MIL/uL   Hemoglobin 15.1 (H) 12.0 - 15.0 g/dL   HCT 47.1 (H) 36.0 - 46.0 %   MCV 95.2 80.0 - 100.0 fL   MCH 30.5 26.0 - 34.0 pg   MCHC 32.1 30.0 - 36.0 g/dL   RDW 12.3 11.5 - 15.5 %   Platelets 314 150 - 400 K/uL   nRBC 0.0 0.0 - 0.2 %    Comment: Performed at [facility], [place] [address], [place], [place] [address]  I-Stat beta  hCG blood, ED     Status: Abnormal   Collection Time: 04/26/21  8:42 AM  Result Value Ref Range   I-stat hCG, quantitative 10.7 (H) <5 mIU/mL   Comment 3            Comment:   GEST. AGE      CONC.  (mIU/mL)   <=1 WEEK        5 - 50     2 WEEKS       50 - 500     3 WEEKS       100 - 10,000     4 WEEKS     1,000 - 30,000        FEMALE AND NON-PREGNANT FEMALE:     LESS THAN 5 mIU/mL   HIV Antibody (routine testing w rflx)     Status: None   Collection Time: 04/26/21  1:58 PM  Result Value Ref Range   HIV Screen 4th Generation wRfx Non Reactive Non Reactive    Comment: Performed at [facility], [place] [address], [place], [place] [address]  Wet prep, genital     Status: Abnormal   Collection Time: 04/26/21  2:38 PM   Specimen: PATH Cytology Cervicovaginal Ancillary Only  Result Value Ref Range   Yeast Wet Prep HPF POC NONE SEEN NONE SEEN   Trich, Wet Prep NONE SEEN NONE SEEN   Clue Cells Wet Prep HPF POC NONE SEEN NONE SEEN   WBC, Wet Prep HPF POC MODERATE (A) NONE SEEN   Sperm NONE SEEN     Comment: Performed at [facility], [place] [address] [place], [place] [address]   CT PELVIS W CONTRAST  Result Date: 04/26/2021 CLINICAL DATA:  Concern for abscess, anal, rectal, or Bartholin's gland abscess EXAM: CT PELVIS WITH CONTRAST TECHNIQUE: Multidetector CT imaging of the pelvis was performed using the standard protocol following the bolus administration of intravenous contrast. CONTRAST:  126m OMNIPAQUE IOHEXOL 300 MG/ML  SOLN COMPARISON:  CT abdomen pelvis 04/24/2021 FINDINGS: Urinary Tract:  No abnormality visualized. Bowel: Scattered diverticula are noted in the sigmoid colon. No findings of diverticulitis. Vascular/Lymphatic: No pathologically enlarged lymph nodes. No significant vascular abnormality seen. Reproductive: Left Bartholin gland demonstrates homogeneous enhancement without associated  fluid collection (series 3, image 50). Intrauterine contraceptive device is in  appropriate position. A 2.8 cm simple appearing cyst is noted in the right ovary (series 3, image 27). Other: A 5.6 cm transverse by 4.9 cm AP by 5.2 cm craniocaudal dimension irregular-shaped rim enhancing fluid collection is noted at the level of the anus, extending into the perineum bilaterally (series 3, image 57; series 6, image 67). There are a few tiny scattered foci of air within the fluid collection. The fluid collection does not extend above the anorectal junction. Musculoskeletal: No suspicious bone lesions identified. IMPRESSION: 1. A 5.6 x 4.9 x 5.2 cm perianal irregular rim enhancing fluid collection concerning for abscess extends into the perineum bilaterally. The fluid collection does not extend above the level of the anorectal junction. 2. Enhancement of the left Bartholin gland without associated fluid collection, consistent with inflammatory changes. These results were called by telephone at the time of interpretation on 04/26/2021 at 5:26 pm to provider LESLIE SOFIA PA (covering provider) , who verbally acknowledged these results. Electronically Signed   By: Ileana Roup MD   On: 04/26/2021 17:32   CT ABDOMEN PELVIS W CONTRAST  Result Date: 04/24/2021 CLINICAL DATA:  Rectal pain for 1 week following heavy lifting, initial encounter EXAM: CT ABDOMEN AND PELVIS WITH CONTRAST TECHNIQUE: Multidetector CT imaging of the abdomen and pelvis was performed using the standard protocol following bolus administration of intravenous contrast. CONTRAST:  127m OMNIPAQUE IOHEXOL 300 MG/ML  SOLN COMPARISON:  None. FINDINGS: Lower chest: Mild dependent atelectatic changes are noted. Hepatobiliary: No focal liver abnormality is seen. No gallstones, gallbladder wall thickening, or biliary dilatation. Pancreas: Unremarkable. No pancreatic ductal dilatation or surrounding inflammatory changes. Spleen: Spleen shows a large inferior cyst measuring 5.2 cm. This appears benign in etiology. Adrenals/Urinary Tract:  Adrenal glands are unremarkable. Kidneys demonstrate a normal enhancement pattern bilaterally. No definitive renal calculi are seen. Bladder is partially distended. The ureters are within normal limits. Stomach/Bowel: Small hiatal hernia is noted. Stomach is otherwise within normal limits. Scattered diverticular change of the colon is noted without diverticulitis. The appendix is not well visualized. No inflammatory changes are identified to suggest appendicitis. Small bowel is within normal limits. Vascular/Lymphatic: No significant vascular findings are present. No enlarged abdominal or pelvic lymph nodes. Reproductive: Uterus is within normal limits. Left adnexa is unremarkable. A 2.9 cm simple cyst is noted within the right adnexa. IUD is noted in place. Other: No abdominal wall hernia or abnormality. No abdominopelvic ascites. Musculoskeletal: No acute or significant osseous findings. IMPRESSION: Diverticulosis without diverticulitis. Small hiatal hernia. Large benign appearing splenic cyst. 2.9 cm simple cyst in the right ovary No follow-up imaging recommended. Note: This recommendation does not apply to premenarchal patients and to those with increased risk (genetic, family history, elevated tumor markers or other high-risk factors) of ovarian cancer. Reference: JACR 2020 Feb; 17(2):248-254 No other focal abnormality is noted. Electronically Signed   By: MInez CatalinaM.D.   On: 04/24/2021 20:36    Review of Systems  Constitutional: Negative.   HENT: Negative.    Respiratory: Negative.    Cardiovascular: Negative.   Gastrointestinal:  Positive for rectal pain.  Neurological: Negative.   All other systems reviewed and are negative.  Blood pressure 122/88, pulse (!) 106, temperature 98.7 F (37.1 C), temperature source Oral, resp. rate 16, SpO2 99 %. Physical Exam Constitutional:      Appearance: She is well-developed.     Comments: Conversant No acute distress  HENT:  Head: Normocephalic  and atraumatic.  Eyes:     General: Lids are normal. No scleral icterus.    Pupils: Pupils are equal, round, and reactive to light.     Comments: Pupils are equal round and reactive No lid lag Moist conjunctiva  Neck:     Thyroid: No thyromegaly.     Trachea: No tracheal tenderness.     Comments: No cervical lymphadenopathy Cardiovascular:     Rate and Rhythm: Normal rate and regular rhythm.     Heart sounds: No murmur heard. Pulmonary:     Effort: Pulmonary effort is normal.     Breath sounds: Normal breath sounds. No wheezing or rales.  Abdominal:     Tenderness: There is no abdominal tenderness.     Hernia: No hernia is present.  Genitourinary:      Comments: Edema extending to the labia. Musculoskeletal:     Cervical back: Normal range of motion and neck supple.  Skin:    General: Skin is warm.     Findings: No rash.     Nails: There is no clubbing.     Comments: Normal skin turgor  Neurological:     Mental Status: She is alert and oriented to person, place, and time.     Comments: Normal gait and station  Psychiatric:        Mood and Affect: Mood normal.        Thought Content: Thought content normal.        Judgment: Judgment normal.     Comments: Appropriate affect     Assessment/Plan 55 year old female with perirectal abscess 1.  Patient given antibiotics in the ER 2.  We will take patient back to the operating room urgently for I&D of perirectal abscess. 3.  I discussed with her the risk and benefits of the procedure to include but limited to: Infection, bleeding, damage surrounding structures, possible need for further surgery.  Patient voiced understanding and wishes to proceed.  Ralene Ok, MD 04/26/2021, 7:43 PM

## 2021-04-27 ENCOUNTER — Encounter (HOSPITAL_COMMUNITY): Payer: Self-pay | Admitting: General Surgery

## 2021-04-27 DIAGNOSIS — E559 Vitamin D deficiency, unspecified: Secondary | ICD-10-CM | POA: Diagnosis present

## 2021-04-27 DIAGNOSIS — Z20822 Contact with and (suspected) exposure to covid-19: Secondary | ICD-10-CM | POA: Diagnosis present

## 2021-04-27 DIAGNOSIS — Z8249 Family history of ischemic heart disease and other diseases of the circulatory system: Secondary | ICD-10-CM | POA: Diagnosis not present

## 2021-04-27 DIAGNOSIS — Z6828 Body mass index (BMI) 28.0-28.9, adult: Secondary | ICD-10-CM | POA: Diagnosis not present

## 2021-04-27 DIAGNOSIS — Z823 Family history of stroke: Secondary | ICD-10-CM | POA: Diagnosis not present

## 2021-04-27 DIAGNOSIS — F419 Anxiety disorder, unspecified: Secondary | ICD-10-CM | POA: Diagnosis present

## 2021-04-27 DIAGNOSIS — D72829 Elevated white blood cell count, unspecified: Secondary | ICD-10-CM | POA: Diagnosis present

## 2021-04-27 DIAGNOSIS — Z83438 Family history of other disorder of lipoprotein metabolism and other lipidemia: Secondary | ICD-10-CM | POA: Diagnosis not present

## 2021-04-27 DIAGNOSIS — Z975 Presence of (intrauterine) contraceptive device: Secondary | ICD-10-CM | POA: Diagnosis not present

## 2021-04-27 DIAGNOSIS — K611 Rectal abscess: Secondary | ICD-10-CM | POA: Diagnosis present

## 2021-04-27 DIAGNOSIS — E663 Overweight: Secondary | ICD-10-CM | POA: Diagnosis present

## 2021-04-27 DIAGNOSIS — Z825 Family history of asthma and other chronic lower respiratory diseases: Secondary | ICD-10-CM | POA: Diagnosis not present

## 2021-04-27 DIAGNOSIS — Z833 Family history of diabetes mellitus: Secondary | ICD-10-CM | POA: Diagnosis not present

## 2021-04-27 LAB — CBC
HCT: 37.5 % (ref 36.0–46.0)
Hemoglobin: 12.2 g/dL (ref 12.0–15.0)
MCH: 31 pg (ref 26.0–34.0)
MCHC: 32.5 g/dL (ref 30.0–36.0)
MCV: 95.2 fL (ref 80.0–100.0)
Platelets: 257 10*3/uL (ref 150–400)
RBC: 3.94 MIL/uL (ref 3.87–5.11)
RDW: 12.4 % (ref 11.5–15.5)
WBC: 15.9 10*3/uL — ABNORMAL HIGH (ref 4.0–10.5)
nRBC: 0 % (ref 0.0–0.2)

## 2021-04-27 LAB — RPR: RPR Ser Ql: NONREACTIVE

## 2021-04-27 LAB — GC/CHLAMYDIA PROBE AMP (~~LOC~~) NOT AT ARMC
Chlamydia: NEGATIVE
Comment: NEGATIVE
Comment: NORMAL
Neisseria Gonorrhea: NEGATIVE

## 2021-04-27 NOTE — Progress Notes (Addendum)
1 Day Post-Op    CC: Perirectal pain  Subjective: Patient feeling much better this AM.  Family is in the room with her.  She is tolerating p.o.'s.  I&D site was examined clean no drainage on the dressing.  Objective: Vital signs in last 24 hours: Temp:  [97 F (36.1 C)-98.8 F (37.1 C)] 98.6 F (37 C) (08/08 2248) Pulse Rate:  [95-115] 105 (08/08 2248) Resp:  [11-23] 16 (08/08 2248) BP: (100-158)/(57-107) 105/74 (08/08 2248) SpO2:  [98 %-100 %] 99 % (08/08 2248) Last BM Date: 04/26/21 850 IV recorded No other intake or output recorded. Afebrile, no vital signs since 2200 hrs. last evening WBC 14.0>> 15.9   Intake/Output from previous day: 08/08 0701 - 08/09 0700 In: 850 [I.V.:800; IV Piggyback:50] Out: 50 [Blood:50] Intake/Output this shift: No intake/output data recorded.  General appearance: alert, cooperative, and no distress Resp: clear to auscultation bilaterally Left perirectal I&D site is clean minimal erythema, no drainage.  We are leaving the packing in place today.  Lab Results:  Recent Labs    04/26/21 0837 04/27/21 0154  WBC 14.0* 15.9*  HGB 15.1* 12.2  HCT 47.1* 37.5  PLT 314 257    BMET Recent Labs    04/24/21 1905 04/26/21 0837  NA 137 137  K 3.6 3.8  CL 104 107  CO2 25 19*  GLUCOSE 102* 98  BUN 15 9  CREATININE 0.85 0.79  CALCIUM 8.8* 8.9   PT/INR No results for input(s): LABPROT, INR in the last 72 hours.  Recent Labs  Lab 04/24/21 1905 04/26/21 0837  AST 15 11*  ALT 17 13  ALKPHOS 50 46  BILITOT 0.8 0.8  PROT 7.2 6.9  ALBUMIN 4.1 3.6     Lipase     Component Value Date/Time   LIPASE 20 04/26/2021 0837     Prior to Admission medications   Medication Sig Start Date End Date Taking? Authorizing Provider  acetaminophen (TYLENOL) 500 MG tablet Take 1,000 mg by mouth every 8 (eight) hours as needed (pain).   Yes [provider]  ALPRAZolam (XANAX) 0.5 MG tablet Take 1 tablet (0.5 mg total) by mouth at bedtime as  needed for sleep. Patient taking differently: Take 0.5 mg by mouth at bedtime as needed for anxiety or sleep. 02/28/20  Yes Taylor, Malena M, DO  Bioflavonoid Products (VITAMIN C) CHEW Chew 1 tablet by mouth daily.   Yes [provider]  Cholecalciferol (VITAMIN D3 PO) Take 1 tablet by mouth daily as needed (immune system boost).   Yes [provider]  diclofenac (VOLTAREN) 75 MG EC tablet Take 1 tablet (75 mg total) by mouth 2 (two) times daily. Prn pain Patient taking differently: Take 75 mg by mouth 2 (two) times daily as needed (pain). 07/23/20  Yes Lovena Le, Malena M, DO  diclofenac Sodium (VOLTAREN) 1 % GEL Apply 1 application topically 4 (four) times daily as needed (pain).   Yes [provider]  FEXOFENADINE HCL PO Take 1 tablet by mouth daily as needed (congestion/allergies).   Yes [provider]  hydrocortisone (ANUSOL-HC) 2.5 % rectal cream Place 1 application rectally 2 (two) times daily. 04/25/21  Yes Triplett, Tammy, PA-C  ibuprofen (ADVIL) 200 MG tablet Take 800 mg by mouth every 8 (eight) hours as needed (pain/headaches.).   Yes [provider]  levonorgestrel (MIRENA) 20 MCG/24HR IUD 1 each by Intrauterine route once. Implanted summer 2021   Yes [provider]  Lidocaine, Anorectal, 5 % CREA Apply 1  application topically 3 (three) times daily as needed. Affected area as needed Patient taking differently: Apply 1 application topically 3 (three) times daily as needed (pain). 04/24/21  Yes Marcello Fennel, PA-C  Multiple Vitamins-Minerals (ZINC PO) Take 1 tablet by mouth daily as needed (immune system boost).   Yes [provider]  oxyCODONE-acetaminophen (PERCOCET/ROXICET) 5-325 MG tablet Take 1 tablet by mouth every 6 (six) hours as needed for severe pain. 04/26/21  Yes Fondaw, Ova [place] S, PA  Probiotic CHEW Chew 1 tablet by mouth daily.   Yes [provider]  tiZANidine (ZANAFLEX) 4 MG tablet Take 1 tablet (4 mg total)  by mouth 3 (three) times daily as needed for muscle spasms. 07/23/20  Yes Lovena Le, Malena M, DO  Nitroglycerin (RECTIV) 0.4 % OINT Place 1 inch rectally daily for 10 days. Patient not taking: No sig reported 04/24/21 05/04/21  Marcello Fennel, PA-C    Medications:  enoxaparin (LOVENOX) injection  40 mg Subcutaneous Q24H   fentaNYL        dextrose 5 % and 0.9% NaCl 75 mL/hr at 04/26/21 2258   piperacillin-tazobactam (ZOSYN)  IV 3.375 g (04/27/21 0505)    Anti-infectives (From admission, onward)    Start     Dose/Rate Route Frequency Ordered Stop   04/27/21 0200  piperacillin-tazobactam (ZOSYN) IVPB 3.375 g        3.375 g 12.5 mL/hr over 240 Minutes Intravenous Every 8 hours 04/26/21 2129     04/26/21 1830  piperacillin-tazobactam (ZOSYN) IVPB 3.375 g        3.375 g 100 mL/hr over 30 Minutes Intravenous  Once 04/26/21 1825 04/26/21 1943        Assessment/Plan  Large perirectal abscess I&D of 5 x 6 cm perirectal abscess 04/26/2021 Dr. Ralene Ok, POD #1  - Leave packing in today  - Continue IV antibiotics  - WBC 14.0>> 15.9   - Plan to remove packing in convert to oral antibiotics/discharge     home tomorrow.  FEN: Regular diet/IV fluid ID: Zosyn 8/8 >> day 2 DVT: Lovenox       LOS: 0 days    Amanda Woodard 04/27/2021 Please see Amion

## 2021-04-27 NOTE — Plan of Care (Signed)

## 2021-04-27 NOTE — Plan of Care (Signed)

## 2021-04-27 NOTE — Progress Notes (Signed)
Patient had a good night although she was in pain after walking to bathing each time, but nurse manage it with PRN pain medicine, surgery site reassessed, no new issues noted, dressing was change 3 times , patient has no other concerns, day shift nurse updated, will continue to monitor.

## 2021-04-28 LAB — CBC
HCT: 38 % (ref 36.0–46.0)
Hemoglobin: 12.4 g/dL (ref 12.0–15.0)
MCH: 31.3 pg (ref 26.0–34.0)
MCHC: 32.6 g/dL (ref 30.0–36.0)
MCV: 96 fL (ref 80.0–100.0)
Platelets: 260 10*3/uL (ref 150–400)
RBC: 3.96 MIL/uL (ref 3.87–5.11)
RDW: 12.3 % (ref 11.5–15.5)
WBC: 9.1 10*3/uL (ref 4.0–10.5)
nRBC: 0 % (ref 0.0–0.2)

## 2021-04-28 MED ORDER — OXYCODONE HCL 5 MG PO TABS: ORAL_TABLET | ORAL | 0 refills | Status: DC

## 2021-04-28 MED ORDER — DICLOFENAC SODIUM 75 MG PO TBEC: 75.0000 mg | DELAYED_RELEASE_TABLET | Freq: Two times a day (BID) | ORAL | Status: AC | PRN

## 2021-04-28 MED ORDER — AMOXICILLIN-POT CLAVULANATE 875-125 MG PO TABS: 1.0000 | ORAL_TABLET | Freq: Two times a day (BID) | ORAL | 0 refills | Status: AC

## 2021-04-28 NOTE — Discharge Summary (Signed)
Physician Discharge Summary  Patient ID: Amanda Woodard MRN: [id number] DOB/AGE: 53-Jul-1967 53 y.o.  Admit date: 04/26/2021 Discharge date: 04/28/2021  Admission Diagnoses:   Perirectal abscess  Discharge Diagnoses:  Same  Active Problems:   Perirectal abscess   PROCEDURES: I&D perirectal abscess, 04/26/21, Dr. 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  She thought this was a hemorrhoid.  She tried hemorrhoid cream however this was unsuccessful.  Patient ultimately continued to progress it was significant.  She states that she had pain with bowel movements.  Patient denies any fevers or chills.  Patient denies any blood per rectum.  Patient underwent CT scan in the ER.  Patient was found to have a large perianal abscess.  Patient with leukocytosis.  DR. Rosendo Gros reviewed the CT scan and labs.  He admitted the patient and took her to the OR that evening.  She underwent the above procedure.  She tolerated it well.  She was placed on IV fluids, IV antibiotics also.  She did well.  The packing was left in place for 24 hrs.  We got her in the shower/sitz bath on 04/28/21.  We removed the packing and the site looked great.  No further drainage, erythema was improved.  She was ready for discharge.  We sent her home on 3 more days of antibiotics, wound care with sitz baths.  She will follow up as noted below.    Condition on DC:  improved   CBC Latest Ref Rng & Units 04/28/2021 04/27/2021 04/26/2021  WBC 4.0 - 10.5 K/uL 9.1 15.9(H) 14.0(H)  Hemoglobin 12.0 - 15.0 g/dL 12.4 12.2 15.1(H)  Hematocrit 36.0 - 46.0 % 38.0 37.5 47.1(H)  Platelets 150 - 400 K/uL 260 257 314   CMP Latest Ref Rng & Units 04/26/2021 04/24/2021 02/21/2020  Glucose 70 - 99 mg/dL 98 102(H) 101(H)  BUN 6 - 20 mg/dL '9 15 16  '$ Creatinine 0.44 - 1.00 mg/dL 0.79 0.85 0.78  Sodium 135 - 145 mmol/L 137 137 141  Potassium 3.5 - 5.1  mmol/L 3.8 3.6 5.1  Chloride 98 - 111 mmol/L 107 104 105  CO2 22 - 32 mmol/L 19(L) 25 22  Calcium 8.9 - 10.3 mg/dL 8.9 8.8(L) 9.0  Total Protein 6.5 - 8.1 g/dL 6.9 7.2 6.8  Total Bilirubin 0.3 - 1.2 mg/dL 0.8 0.8 0.5  Alkaline Phos 38 - 126 U/L 46 50 44(L)  AST 15 - 41 U/L 11(L) 15 17  ALT 0 - 44 U/L '13 17 23    '$ Disposition: Discharge disposition: 01-Home or Self Care      Allergies as of 04/28/2021   No Known Allergies      Medication List     STOP taking these medications    hydrocortisone 2.5 % rectal cream Commonly known as: ANUSOL-HC   ibuprofen 200 MG tablet Commonly known as: ADVIL   Lidocaine (Anorectal) 5 % Crea   Rectiv 0.4 % Oint Generic drug: Nitroglycerin       TAKE these medications    acetaminophen 500 MG tablet Commonly known as: TYLENOL Take 1,000 mg by mouth every 8 (eight) hours as needed (pain).   amoxicillin-clavulanate 875-125 MG tablet Commonly known as: Augmentin Take 1 tablet by mouth 2 (two) times daily for 7 days.   diclofenac 75 MG EC tablet Commonly known as: VOLTAREN Take 1 tablet (75 mg total) by mouth 2 (two)  times daily as needed (pain).   diclofenac Sodium 1 % Gel Commonly known as: VOLTAREN Apply 1 application topically 4 (four) times daily as needed (pain).   FEXOFENADINE HCL PO Take 1 tablet by mouth daily as needed (congestion/allergies).   levonorgestrel 20 MCG/24HR IUD Commonly known as: MIRENA 1 each by Intrauterine route once. Implanted summer 2021   oxyCODONE 5 MG immediate release tablet Commonly known as: Oxy IR/ROXICODONE You can take 1 tablet every 6 hours as needed for pain not relieved by plain Tylenol/acetaminophen, and ibuprofen   Probiotic Chew Chew 1 tablet by mouth daily.   tiZANidine 4 MG tablet Commonly known as: ZANAFLEX Take 1 tablet (4 mg total) by mouth 3 (three) times daily as needed for muscle spasms.   Vitamin C Chew Chew 1 tablet by mouth daily.   VITAMIN D3 PO Take 1 tablet  by mouth daily as needed (immune system boost).   ZINC PO Take 1 tablet by mouth daily as needed (immune system boost).       ASK your doctor about these medications    ALPRAZolam 0.5 MG tablet Commonly known as: XANAX Take 1 tablet (0.5 mg total) by mouth at bedtime as needed for sleep.        Follow-up Information     [facility] Follow up on 05/20/2021.   Specialty: General Surgery Why: your appointment is at 1:45 PM. Be at the office 30 minutes early for check in.  Bring photo ID and insurance informtation. Contact information: [address] [place] [address] [phone]         Elvia Collum M, DO Follow up.   Specialty: Family Medicine Why: Let them know you had an abscess and follow up for medical issues. Contact information: [place] [address] [phone]                 Signed: Earnstine Regal 04/28/2021, 10:48 AM

## 2021-04-28 NOTE — Progress Notes (Signed)
Discharge summary packet provided to pt with instructions. Pt verbalized understanding of instructions. All questions and concerns were fully addressed . No complaints voiced. Pt d/c to home as ordered. Pt's spouse is responsible for pt's ride.

## 2021-04-28 NOTE — Progress Notes (Signed)
2 Days Post-Op    CC: Perirectal pain  Subjective: Packing was removed while patient was in the shower.  She tolerated this well.  There was no drainage, and no erythema.  Objective: Vital signs in last 24 hours: Temp:  [98.2 F (36.8 C)-98.3 F (36.8 C)] 98.2 F (36.8 C) (08/09 1920) Pulse Rate:  [85-95] 85 (08/09 1920) Resp:  [16-18] 16 (08/09 1920) BP: (94-114)/(63-70) 106/68 (08/09 1920) SpO2:  [96 %-98 %] 98 % (08/09 1920) Last BM Date: 04/26/21 840 p.o. recorded 700 IV Voided x4 No BM recorded Afebrile vital signs are stable WBC 9.1  Intake/Output from previous day: 08/09 0701 - 08/10 0700 In: 1541.6 [P.O.:840; I.V.:601.6; IV Piggyback:100] Out: -  Intake/Output this shift: No intake/output data recorded.  General appearance: alert, cooperative, and no distress Perirectal incision: Packing was removed it was a little bloody but there was no purulent drainage.  There is no erythema around the site.  Patient did well with the packing removal.  Lab Results:  Recent Labs    04/27/21 0154 04/28/21 0139  WBC 15.9* 9.1  HGB 12.2 12.4  HCT 37.5 38.0  PLT 257 260    BMET Recent Labs    04/26/21 0837  NA 137  K 3.8  CL 107  CO2 19*  GLUCOSE 98  BUN 9  CREATININE 0.79  CALCIUM 8.9   PT/INR No results for input(s): LABPROT, INR in the last 72 hours.  Recent Labs  Lab 04/24/21 1905 04/26/21 0837  AST 15 11*  ALT 17 13  ALKPHOS 50 46  BILITOT 0.8 0.8  PROT 7.2 6.9  ALBUMIN 4.1 3.6     Lipase     Component Value Date/Time   LIPASE 20 04/26/2021 0837     Medications:  enoxaparin (LOVENOX) injection  40 mg Subcutaneous Q24H    Assessment/Plan I&D of 5 x 6 cm perirectal abscess 04/26/2021 Dr. Ralene Ok, POD #1  - Leave packing in today  - Continue IV antibiotics  - WBC 14.0>> 15.9>> 9.1   - Plan to remove packing in convert to oral antibiotics/discharge     home tomorrow.   FEN: Regular diet/IV fluid ID: Zosyn 8/8 >> day 2 DVT:  Lovenox   Plan: Home today with 3 additional days of antibiotics.  Patient instructed in care of the wound.  Follow-up in our office in 2 to 3 weeks.      LOS: 1 day    Cortni Tays 04/28/2021 Please see Amion

## 2021-04-29 NOTE — Anesthesia Postprocedure Evaluation (Signed)
Anesthesia Post Note  Patient: Amanda Woodard  Procedure(s) Performed: IRRIGATION AND DEBRIDEMENT PERIRECTAL ABSCESS (Rectum)     Patient location during evaluation: PACU Anesthesia Type: General Level of consciousness: awake and alert Pain management: pain level controlled Vital Signs Assessment: post-procedure vital signs reviewed and stable Respiratory status: spontaneous breathing, nonlabored ventilation, respiratory function stable and patient connected to nasal cannula oxygen Cardiovascular status: blood pressure returned to baseline and stable Postop Assessment: no apparent nausea or vomiting Anesthetic complications: no   No notable events documented.  Last Vitals:  Vitals:   04/27/21 1920 04/28/21 0846  BP: 106/68 117/79  Pulse: 85 87  Resp: 16 17  Temp: 36.8 C 36.6 C  SpO2: 98% 99%    Last Pain:  Vitals:   04/28/21 0846  TempSrc: Oral  PainSc:    Pain Goal: Patients Stated Pain Goal: 1 (04/26/21 2301)                 [place]

## 2021-04-30 LAB — AEROBIC/ANAEROBIC CULTURE W GRAM STAIN (SURGICAL/DEEP WOUND)

## 2021-05-04 ENCOUNTER — Telehealth: Payer: Self-pay | Admitting: Family Medicine

## 2021-05-04 MED ORDER — FLUCONAZOLE 150 MG PO TABS: ORAL_TABLET | ORAL | 0 refills | Status: DC

## 2021-05-04 NOTE — Telephone Encounter (Signed)
Patient is needing something called in for yeast infection  from antibiotic from surgery she had on abscess on her bottom removed. [place]

## 2021-05-04 NOTE — Telephone Encounter (Signed)
Pt states she is having itching. Pt has abscess in vaginal/perineal area and had surgery and has been on antibiotic for 3 weeks. Diflucan sent in per protocol

## 2021-05-07 ENCOUNTER — Ambulatory Visit: Payer: 59 | Admitting: Obstetrics & Gynecology

## 2021-05-19 ENCOUNTER — Ambulatory Visit: Payer: 59 | Admitting: Physician Assistant

## 2021-06-24 ENCOUNTER — Telehealth: Payer: Self-pay | Admitting: Family Medicine

## 2021-06-24 MED ORDER — FLUCONAZOLE 150 MG PO TABS: ORAL_TABLET | ORAL | 0 refills | Status: DC

## 2021-06-24 NOTE — Telephone Encounter (Signed)
Pt contacted. Pt currently in [place]. Pt having itching and some burning. Pt had emergency surgery in August and has been on antibiotics off and on since August. Pt was in [place] for vacation over the weekend and was in the pool and hot tubs. Diflucan sent in per protocol. No fever, pelvic pain or bleeding. Pt advised to call office back if symptoms persist, pt verbalized understanding.

## 2021-06-24 NOTE — Telephone Encounter (Signed)
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 I did advise patient that she might have to be seen prior to a prescription being called in.  CB# [phone]

## 2021-08-06 ENCOUNTER — Other Ambulatory Visit: Payer: Self-pay

## 2021-08-06 ENCOUNTER — Ambulatory Visit
Admission: EM | Admit: 2021-08-06 | Discharge: 2021-08-06 | Disposition: A | Discharge status: Home / Self Care | Payer: 59 | Attending: Family Medicine | Admitting: Family Medicine

## 2021-08-06 DIAGNOSIS — R1012 Left upper quadrant pain: Secondary | ICD-10-CM | POA: Insufficient documentation

## 2021-08-06 LAB — POCT URINALYSIS DIP (MANUAL ENTRY)
Bilirubin, UA: NEGATIVE
Glucose, UA: NEGATIVE mg/dL
Ketones, POC UA: NEGATIVE mg/dL
Nitrite, UA: NEGATIVE
Protein Ur, POC: NEGATIVE mg/dL
Spec Grav, UA: 1.025 (ref 1.010–1.025)
Urobilinogen, UA: 0.2 E.U./dL
pH, UA: 5.5 (ref 5.0–8.0)

## 2021-08-06 NOTE — ED Triage Notes (Signed)
Pt presents with LUQ pain x 3 days. Pt concerned for diverticulitis. Pt states she had colonoscopy 1 year ago and several polyps were found

## 2021-08-06 NOTE — ED Provider Notes (Signed)
[facility]    CSN: [id number] Arrival date & time: 08/06/21  0802      History   Chief Complaint Chief Complaint  Patient presents with   Abdominal Pain    LUQ    HPI Amanda Woodard is a 55 y.o. female.   Patient presenting today with 3-day history of fairly constant left upper quadrant pain.  States the pain seems to be a bit worse with certain movements and sneezing.  She denies associated fever, chills, nausea, vomiting, diarrhea, constipation, urinary symptoms, vaginal symptoms, upper respiratory symptoms.  No new medications or dietary changes.  No new sick contacts.  No known chronic GI issues.  She states she has a strong family history of diverticulitis and was told she had polyps on her most recent colonoscopy so she is concerned that this may be representative of a diverticulitis flare.  She has not tried any medications over-the-counter for symptoms thus far.   Past Medical History:  Diagnosis Date   Vitamin D deficiency     Patient Active Problem List   Diagnosis Date Noted   Perirectal abscess 04/26/2021   COVID-19 virus infection 02/04/2021   Acute bacterial rhinosinusitis 02/04/2021   Cough 02/04/2021   Chest tightness 02/04/2021   History of ovarian cyst 02/28/2020   Sciatica, left side 02/28/2020   History of COVID-19 02/28/2020   Overweight (BMI 25.0-29.9) 02/28/2020   Anxiety 02/28/2020    Past Surgical History:  Procedure Laterality Date   CYST EXCISION  2005   HEAD   INCISION AND DRAINAGE PERIRECTAL ABSCESS N/A 04/26/2021   Procedure: IRRIGATION AND DEBRIDEMENT PERIRECTAL ABSCESS;  Surgeon: Ralene Ok, MD;  Location: [place];  Service: General;  Laterality: N/A;    OB History     Gravida  2   Para  2   Term      Preterm      AB      Living  2      SAB      IAB      Ectopic      Multiple      Live Births               Home Medications    Prior to Admission medications   Medication Sig Start Date End  Date Taking? Authorizing Provider  acetaminophen (TYLENOL) 500 MG tablet Take 1,000 mg by mouth every 8 (eight) hours as needed (pain).    [provider]  ALPRAZolam Duanne Moron) 0.5 MG tablet Take 1 tablet (0.5 mg total) by mouth at bedtime as needed for sleep. Patient taking differently: Take 0.5 mg by mouth at bedtime as needed for anxiety or sleep. 02/28/20   Elvia Collum M, DO  Bioflavonoid Products (VITAMIN C) CHEW Chew 1 tablet by mouth daily.    [provider]  Cholecalciferol (VITAMIN D3 PO) Take 1 tablet by mouth daily as needed (immune system boost).    [provider]  diclofenac (VOLTAREN) 75 MG EC tablet Take 1 tablet (75 mg total) by mouth 2 (two) times daily as needed (pain). 04/28/21   Earnstine Regal, PA-C  diclofenac Sodium (VOLTAREN) 1 % GEL Apply 1 application topically 4 (four) times daily as needed (pain).    [provider]  FEXOFENADINE HCL PO Take 1 tablet by mouth daily as needed (congestion/allergies).    [provider]  fluconazole (DIFLUCAN) 150 MG tablet Take one tablet po now one 3 days later if needed 05/04/21   Sallee Lange  A, MD  fluconazole (DIFLUCAN) 150 MG tablet Take one tablet po now and may repeat in 3 days 06/24/21   Kathyrn Drown, MD  levonorgestrel (MIRENA) 20 MCG/24HR IUD 1 each by Intrauterine route once. Implanted summer 2021    [provider]  Multiple Vitamins-Minerals (ZINC PO) Take 1 tablet by mouth daily as needed (immune system boost).    [provider]  oxyCODONE (OXY IR/ROXICODONE) 5 MG immediate release tablet You can take 1 tablet every 6 hours as needed for pain not relieved by plain Tylenol/acetaminophen, and ibuprofen 04/28/21   Earnstine Regal, PA-C  Probiotic CHEW Chew 1 tablet by mouth daily.    [provider]  tiZANidine (ZANAFLEX) 4 MG tablet Take 1 tablet (4 mg total) by mouth 3 (three) times daily as needed for muscle spasms. 07/23/20   Erven Colla, DO     Family History Family History  Problem Relation Age of Onset   Heart disease Father 32   Hyperlipidemia Father    Hypertension Father    COPD Maternal Grandfather    Diabetes Mother    Heart attack Paternal Grandfather    Stroke Maternal Grandmother    Dementia Maternal Grandmother    Obesity Brother    Colon cancer Neg Hx    Esophageal cancer Neg Hx    Rectal cancer Neg Hx    Stomach cancer Neg Hx     Social History Social History   Tobacco Use   Smoking status: Never   Smokeless tobacco: Never  Vaping Use   Vaping Use: Never used  Substance Use Topics   Alcohol use: Yes   Drug use: No     Allergies   Patient has no known allergies.   Review of Systems Review of Systems PER HPI   Physical Exam Triage Vital Signs ED Triage Vitals  Enc Vitals Group     BP 08/06/21 0810 (!) 160/98     Pulse Rate 08/06/21 0810 94     Resp 08/06/21 0810 14     Temp 08/06/21 0810 98 F (36.7 C)     Temp Source 08/06/21 0810 Oral     SpO2 08/06/21 0810 96 %     Weight --      Height --      Head Circumference --      Peak Flow --      Pain Score 08/06/21 0811 1     Pain Loc --      Pain Edu? --      Excl. in [place]? --    No data found.  Updated Vital Signs BP (!) 154/98 (BP Location: Right Arm)   Pulse 93   Temp 97.9 F (36.6 C) (Oral)   Resp 14   SpO2 97%   Visual Acuity Right Eye Distance:   Left Eye Distance:   Bilateral Distance:    Right Eye Near:   Left Eye Near:    Bilateral Near:     Physical Exam Vitals and nursing note reviewed.  Constitutional:      Appearance: Normal appearance. She is not ill-appearing.  HENT:     Head: Atraumatic.     Mouth/Throat:     Mouth: Mucous membranes are moist.     Pharynx: Oropharynx is clear. No posterior oropharyngeal erythema.  Eyes:     Extraocular Movements: Extraocular movements intact.     Conjunctiva/sclera: Conjunctivae normal.  Cardiovascular:     Rate and Rhythm: Normal rate and regular  rhythm.  Heart sounds: Normal heart sounds.  Pulmonary:     Effort: Pulmonary effort is normal.     Breath sounds: Normal breath sounds.  Abdominal:     General: Bowel sounds are normal. There is no distension.     Palpations: Abdomen is soft.     Tenderness: There is abdominal tenderness. There is no right CVA tenderness, left CVA tenderness or guarding.     Comments: Mild LUQ ttp without guarding or distention  Musculoskeletal:        General: Normal range of motion.     Cervical back: Normal range of motion and neck supple.  Skin:    General: Skin is warm and dry.  Neurological:     Mental Status: She is alert and oriented to person, place, and time.  Psychiatric:        Mood and Affect: Mood normal.        Thought Content: Thought content normal.        Judgment: Judgment normal.     UC Treatments / Results  Labs (all labs ordered are listed, but only abnormal results are displayed) Labs Reviewed  POCT URINALYSIS DIP (MANUAL ENTRY) - Abnormal; Notable for the following components:      Result Value   Blood, UA trace-intact (*)    Leukocytes, UA Trace (*)    All other components within normal limits  URINE CULTURE  CBC WITH DIFFERENTIAL/PLATELET  COMPREHENSIVE METABOLIC PANEL  LIPASE    EKG   Radiology No results found.  Procedures Procedures (including critical care time)  Medications Ordered in UC Medications - No data to display  Initial Impression / Assessment and Plan / UC Course  I have reviewed the triage vital signs and the nursing notes.  Pertinent labs & imaging results that were available during my care of the patient were reviewed by me and considered in my medical decision making (see chart for details).     Very mildly elevated blood pressure reading in triage and on recheck, suspect secondary to stress regarding upcoming travel this weekend and her pain.  Otherwise vital signs within normal limits and exam reassuring with no red flag  findings.  Unclear if related to a small kidney stone, muscular, gas pains, etc.  Urinalysis negative for urinary tract infection, showing trace leuks and hemoglobin so urine culture sent.  Did discuss the small possibility of a kidney stone and to drink plenty of fluids in case.  Labs pending for safety check.  ED for worsening symptoms.  Over-the-counter pain relievers, rest, fluids recommended.  Final Clinical Impressions(s) / UC Diagnoses   Final diagnoses:  LUQ pain   Discharge Instructions   None    ED Prescriptions   None    PDMP not reviewed this encounter.   Merrie Roof [place], [place] 08/06/21 [phone]

## 2021-08-07 LAB — CBC WITH DIFFERENTIAL/PLATELET
Basophils Absolute: 0 10*3/uL (ref 0.0–0.2)
Basos: 0 %
EOS (ABSOLUTE): 0.4 10*3/uL (ref 0.0–0.4)
Eos: 5 %
Hematocrit: 47.8 % — ABNORMAL HIGH (ref 34.0–46.6)
Hemoglobin: 15.8 g/dL (ref 11.1–15.9)
Immature Grans (Abs): 0 10*3/uL (ref 0.0–0.1)
Immature Granulocytes: 0 %
Lymphocytes Absolute: 1.5 10*3/uL (ref 0.7–3.1)
Lymphs: 22 %
MCH: 31.3 pg (ref 26.6–33.0)
MCHC: 33.1 g/dL (ref 31.5–35.7)
MCV: 95 fL (ref 79–97)
Monocytes Absolute: 0.5 10*3/uL (ref 0.1–0.9)
Monocytes: 8 %
Neutrophils Absolute: 4.3 10*3/uL (ref 1.4–7.0)
Neutrophils: 65 %
Platelets: 262 10*3/uL (ref 150–450)
RBC: 5.04 x10E6/uL (ref 3.77–5.28)
RDW: 12.1 % (ref 11.7–15.4)
WBC: 6.7 10*3/uL (ref 3.4–10.8)

## 2021-08-07 LAB — COMPREHENSIVE METABOLIC PANEL
ALT: 23 IU/L (ref 0–32)
AST: 20 IU/L (ref 0–40)
Albumin/Globulin Ratio: 2 (ref 1.2–2.2)
Albumin: 4.5 g/dL (ref 3.8–4.9)
Alkaline Phosphatase: 56 IU/L (ref 44–121)
BUN/Creatinine Ratio: 16 (ref 9–23)
BUN: 12 mg/dL (ref 6–24)
Bilirubin Total: 0.5 mg/dL (ref 0.0–1.2)
CO2: 20 mmol/L (ref 20–29)
Calcium: 9 mg/dL (ref 8.7–10.2)
Chloride: 106 mmol/L (ref 96–106)
Creatinine, Ser: 0.74 mg/dL (ref 0.57–1.00)
Globulin, Total: 2.2 g/dL (ref 1.5–4.5)
Glucose: 118 mg/dL — ABNORMAL HIGH (ref 70–99)
Potassium: 4.4 mmol/L (ref 3.5–5.2)
Sodium: 140 mmol/L (ref 134–144)
Total Protein: 6.7 g/dL (ref 6.0–8.5)
eGFR: 97 mL/min/{1.73_m2} (ref >59)

## 2021-08-07 LAB — LIPASE: Lipase: 21 U/L (ref 14–72)

## 2021-08-08 LAB — URINE CULTURE

## 2022-09-25 ENCOUNTER — Ambulatory Visit
Admission: EM | Admit: 2022-09-25 | Discharge: 2022-09-25 | Disposition: A | Discharge status: Home / Self Care | Payer: Commercial Managed Care - PPO | Attending: Nurse Practitioner | Admitting: Nurse Practitioner

## 2022-09-25 DIAGNOSIS — R399 Unspecified symptoms and signs involving the genitourinary system: Secondary | ICD-10-CM | POA: Diagnosis present

## 2022-09-25 DIAGNOSIS — N10 Acute tubulo-interstitial nephritis: Secondary | ICD-10-CM | POA: Insufficient documentation

## 2022-09-25 LAB — POCT URINALYSIS DIP (MANUAL ENTRY)
Bilirubin, UA: NEGATIVE
Glucose, UA: NEGATIVE mg/dL
Ketones, POC UA: NEGATIVE mg/dL
Nitrite, UA: NEGATIVE
Protein Ur, POC: 100 mg/dL — AB
Spec Grav, UA: 1.015 (ref 1.010–1.025)
Urobilinogen, UA: 0.2 E.U./dL
pH, UA: 5 (ref 5.0–8.0)

## 2022-09-25 MED ORDER — SULFAMETHOXAZOLE-TRIMETHOPRIM 800-160 MG PO TABS: 1.0000 | ORAL_TABLET | Freq: Two times a day (BID) | ORAL | 0 refills | Status: DC

## 2022-09-25 NOTE — Discharge Instructions (Addendum)
-  Take medications as prescribed. -Increase fluids. -Ibuprofen or Tylenol for pain, fever, or general discomfort. -Develop a toileting schedule that will allow you to toilet at least every 2 hours. -Avoid caffeine to include tea, soda, and coffee. -If sexually active, void at least 15 to 20 minutes after sexual intercourse. -Follow-up in the emergency department if you to experience symptoms and develop new symptoms such as fever, chills, worsening abdominal pain, or other concerns.

## 2022-09-25 NOTE — ED Provider Notes (Signed)
[facility]    CSN: [id number] Arrival date & time: 09/25/22  0802      History   Chief Complaint Chief Complaint  Patient presents with   Urinary Frequency   Back Pain    HPI Amanda Woodard is a 57 y.o. female.   The history is provided by the patient.   The patient presents for complaints of urinary frequency and low back pain.  Patient states urinary frequency started approximately 4 days ago with low back pain starting last evening.  She denies fever, chills, abdominal pain, nausea, vomiting, hematuria, dysuria, decreased urine stream, or flank pain.  Patient denies history of recurrent urinary tract infections or pyelonephritis.  She states she has not taken any medication for her symptoms.  Past Medical History:  Diagnosis Date   Vitamin D deficiency     Patient Active Problem List   Diagnosis Date Noted   Perirectal abscess 04/26/2021   COVID-19 virus infection 02/04/2021   Acute bacterial rhinosinusitis 02/04/2021   Cough 02/04/2021   Chest tightness 02/04/2021   History of ovarian cyst 02/28/2020   Sciatica, left side 02/28/2020   History of COVID-19 02/28/2020   Overweight (BMI 25.0-29.9) 02/28/2020   Anxiety 02/28/2020    Past Surgical History:  Procedure Laterality Date   CYST EXCISION  2005   HEAD   INCISION AND DRAINAGE PERIRECTAL ABSCESS N/A 04/26/2021   Procedure: IRRIGATION AND DEBRIDEMENT PERIRECTAL ABSCESS;  Surgeon: Ralene Ok, MD;  Location: [place];  Service: General;  Laterality: N/A;    OB History     Gravida  2   Para  2   Term      Preterm      AB      Living  2      SAB      IAB      Ectopic      Multiple      Live Births               Home Medications    Prior to Admission medications   Medication Sig Start Date End Date Taking? Authorizing Provider  sulfamethoxazole-trimethoprim (BACTRIM DS) 800-160 MG tablet Take 1 tablet by mouth 2 (two) times daily. 09/25/22  Yes Lorie Melichar-Warren, Alda Lea,  NP  acetaminophen (TYLENOL) 500 MG tablet Take 1,000 mg by mouth every 8 (eight) hours as needed (pain).    [provider]  ALPRAZolam Duanne Moron) 0.5 MG tablet Take 1 tablet (0.5 mg total) by mouth at bedtime as needed for sleep. Patient taking differently: Take 0.5 mg by mouth at bedtime as needed for anxiety or sleep. 02/28/20   Elvia Collum M, DO  Bioflavonoid Products (VITAMIN C) CHEW Chew 1 tablet by mouth daily.    [provider]  Cholecalciferol (VITAMIN D3 PO) Take 1 tablet by mouth daily as needed (immune system boost).    [provider]  diclofenac (VOLTAREN) 75 MG EC tablet Take 1 tablet (75 mg total) by mouth 2 (two) times daily as needed (pain). 04/28/21   Earnstine Regal, PA-C  diclofenac Sodium (VOLTAREN) 1 % GEL Apply 1 application topically 4 (four) times daily as needed (pain).    [provider]  FEXOFENADINE HCL PO Take 1 tablet by mouth daily as needed (congestion/allergies).    [provider]  fluconazole (DIFLUCAN) 150 MG tablet Take one tablet po now one 3 days later if needed 05/04/21   Kathyrn Drown, MD  fluconazole (DIFLUCAN) 150 MG tablet Take one  tablet po now and may repeat in 3 days 06/24/21   Kathyrn Drown, MD  levonorgestrel (MIRENA) 20 MCG/24HR IUD 1 each by Intrauterine route once. Implanted summer 2021    [provider]  Multiple Vitamins-Minerals (ZINC PO) Take 1 tablet by mouth daily as needed (immune system boost).    [provider]  oxyCODONE (OXY IR/ROXICODONE) 5 MG immediate release tablet You can take 1 tablet every 6 hours as needed for pain not relieved by plain Tylenol/acetaminophen, and ibuprofen 04/28/21   Earnstine Regal, PA-C  Probiotic CHEW Chew 1 tablet by mouth daily.    [provider]  tiZANidine (ZANAFLEX) 4 MG tablet Take 1 tablet (4 mg total) by mouth 3 (three) times daily as needed for muscle spasms. 07/23/20   Erven Colla, DO    Family History Family  History  Problem Relation Age of Onset   Heart disease Father 68   Hyperlipidemia Father    Hypertension Father    COPD Maternal Grandfather    Diabetes Mother    Heart attack Paternal Grandfather    Stroke Maternal Grandmother    Dementia Maternal Grandmother    Obesity Brother    Colon cancer Neg Hx    Esophageal cancer Neg Hx    Rectal cancer Neg Hx    Stomach cancer Neg Hx     Social History Social History   Tobacco Use   Smoking status: Never   Smokeless tobacco: Never  Vaping Use   Vaping Use: Never used  Substance Use Topics   Alcohol use: Yes   Drug use: No     Allergies   Patient has no known allergies.   Review of Systems Review of Systems Per HPI  Physical Exam Triage Vital Signs ED Triage Vitals [09/25/22 0810]  Enc Vitals Group     BP (!) 147/101     Pulse Rate 92     Resp 16     Temp 98 F (36.7 C)     Temp Source Oral     SpO2 97 %     Weight      Height      Head Circumference      Peak Flow      Pain Score      Pain Loc      Pain Edu?      Excl. in [place]?    No data found.  Updated Vital Signs BP (!) 147/101 (BP Location: Right Arm)   Pulse 92   Temp 98 F (36.7 C) (Oral)   Resp 16   SpO2 97%   Visual Acuity Right Eye Distance:   Left Eye Distance:   Bilateral Distance:    Right Eye Near:   Left Eye Near:    Bilateral Near:     Physical Exam Vitals and nursing note reviewed.  Constitutional:      General: She is not in acute distress.    Appearance: Normal appearance.  HENT:     Head: Normocephalic.  Eyes:     Extraocular Movements: Extraocular movements intact.     Conjunctiva/sclera: Conjunctivae normal.     Pupils: Pupils are equal, round, and reactive to light.  Cardiovascular:     Rate and Rhythm: Normal rate and regular rhythm.     Pulses: Normal pulses.     Heart sounds: Normal heart sounds.  Pulmonary:     Effort: Pulmonary effort is normal. No respiratory distress.     Breath sounds: Normal breath  sounds. No stridor. No wheezing, rhonchi or rales.  Abdominal:     General: Bowel sounds are normal.     Palpations: Abdomen is soft.     Tenderness: There is left CVA tenderness.  Musculoskeletal:     Cervical back: Normal range of motion.  Lymphadenopathy:     Cervical: No cervical adenopathy.  Skin:    General: Skin is warm and dry.  Neurological:     General: No focal deficit present.     Mental Status: She is alert and oriented to person, place, and time.  Psychiatric:        Mood and Affect: Mood normal.        Behavior: Behavior normal.      UC Treatments / Results  Labs (all labs ordered are listed, but only abnormal results are displayed) Labs Reviewed  POCT URINALYSIS DIP (MANUAL ENTRY) - Abnormal; Notable for the following components:      Result Value   Clarity, UA hazy (*)    Blood, UA large (*)    Protein Ur, POC =100 (*)    Leukocytes, UA Large (3+) (*)    All other components within normal limits  URINE CULTURE    EKG   Radiology No results found.  Procedures Procedures (including critical care time)  Medications Ordered in UC Medications - No data to display  Initial Impression / Assessment and Plan / UC Course  I have reviewed the triage vital signs and the nursing notes.  Pertinent labs & imaging results that were available during my care of the patient were reviewed by me and considered in my medical decision making (see chart for details).  The patient is well-appearing, she is in no acute distress, vital signs are stable.  Will treat patient empirically for pyelonephritis based on her exam.  Patient has left CVA tenderness, urinalysis is positive for large leukocytes.  Urine culture is pending.  Will start patient on Bactrim DS 800/160 mg tablets for 7 days.  Supportive care recommendations were provided to the patient to include increasing fluids, allowing for plenty of rest, and developing a toileting schedule while symptoms persist.   Patient was advised that if the urine culture results are negative or if the antibiotic needs to be changed, she will be contacted.  Patient was advised that if her pending test results are negative, and she is continuing to have symptoms, she will need to follow-up with her PCP.  Patient verbalizes understanding.  All questions were answered.  Patient stable for discharge.   Final Clinical Impressions(s) / UC Diagnoses   Final diagnoses:  Urinary tract infection symptoms  Acute pyelonephritis     Discharge Instructions      -Take medications as prescribed. -Increase fluids. -Ibuprofen or Tylenol for pain, fever, or general discomfort. -Develop a toileting schedule that will allow you to toilet at least every 2 hours. -Avoid caffeine to include tea, soda, and coffee. -If sexually active, void at least 15 to 20 minutes after sexual intercourse. -Follow-up in the emergency department if you to experience symptoms and develop new symptoms such as fever, chills, worsening abdominal pain, or other concerns.     ED Prescriptions     Medication Sig Dispense Auth. Provider   sulfamethoxazole-trimethoprim (BACTRIM DS) 800-160 MG tablet Take 1 tablet by mouth 2 (two) times daily. 14 tablet Donato Studley-Warren, Alda Lea, NP      PDMP not reviewed this encounter.   Tish Men, NP 09/25/22 [phone]

## 2022-09-25 NOTE — ED Triage Notes (Signed)
Pt reports increase urinary frequency x 4 days; low back pain  since last night.

## 2022-09-27 LAB — URINE CULTURE: Culture: >=100000 — AB

## 2022-10-24 ENCOUNTER — Telehealth: Payer: Self-pay

## 2022-10-24 DIAGNOSIS — E559 Vitamin D deficiency, unspecified: Secondary | ICD-10-CM

## 2022-10-24 DIAGNOSIS — Z0000 Encounter for general adult medical examination without abnormal findings: Secondary | ICD-10-CM

## 2022-10-24 NOTE — Telephone Encounter (Signed)
Pt has a health check up with Hoyle Sauer on March the 15 th and she is wanting her blood work done   [employer] back [phone]

## 2022-10-24 NOTE — Telephone Encounter (Signed)
I would recommend CBC, CMP, lipid, vitamin D, TSH Diagnosis wellness, vitamin D deficiency

## 2022-10-24 NOTE — Telephone Encounter (Signed)
Sent message via [place] labs have been put for next office visit.

## 2022-11-17 IMAGING — CT CT ABD-PELV W/ CM
2 of 5 series · 16 of 46 positions shown, 18 images · IV contrast (Omnipaque or Isovue)
Comparison: None.

CLINICAL DATA: Rectal pain for 1 week following heavy lifting,
initial encounter

EXAM:
CT ABDOMEN AND PELVIS WITH CONTRAST
TECHNIQUE: Multidetector CT imaging of the abdomen and pelvis was performed
using the standard protocol following bolus administration of
intravenous contrast.
CONTRAST:  100mL OMNIPAQUE IOHEXOL 300 MG/ML  SOLN

[Series 2: axial st · axial · 0.70mm/px · z∈[+612,+1007]mm · 13 of 91 slices shown, 15 images]
[im 6/91  soft-tissue]
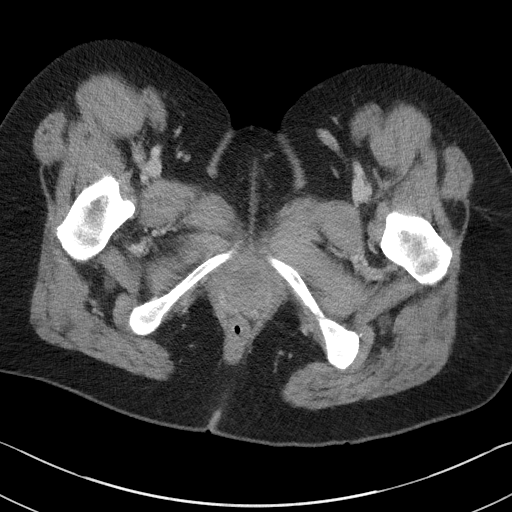
[im 6/91  bone]
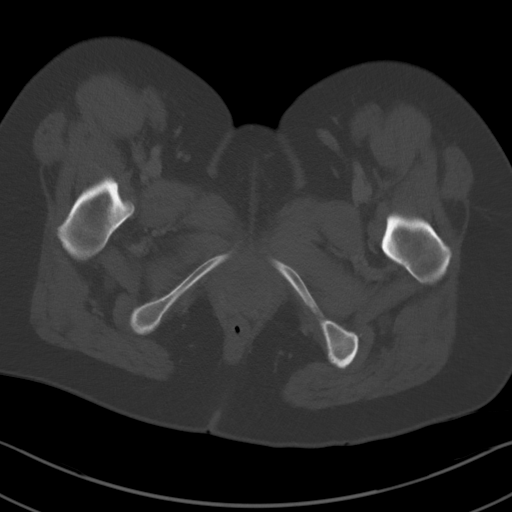
[im 11/91  soft-tissue]
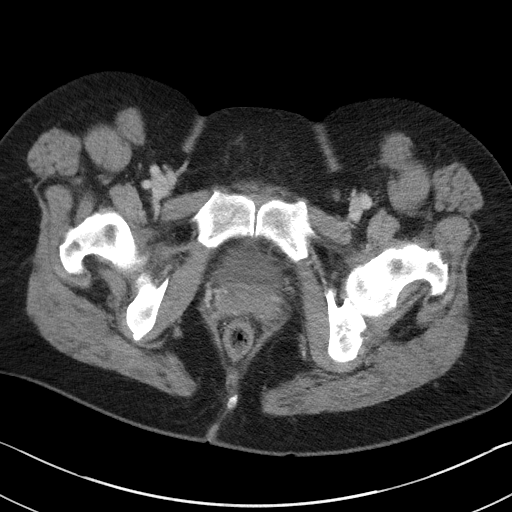
[im 22/91  soft-tissue]
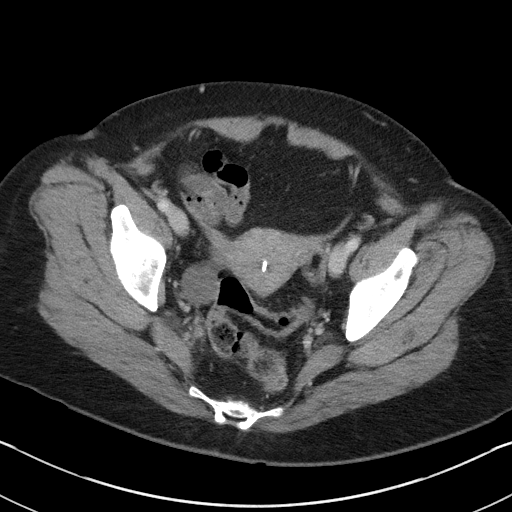
[im 27/91  soft-tissue]
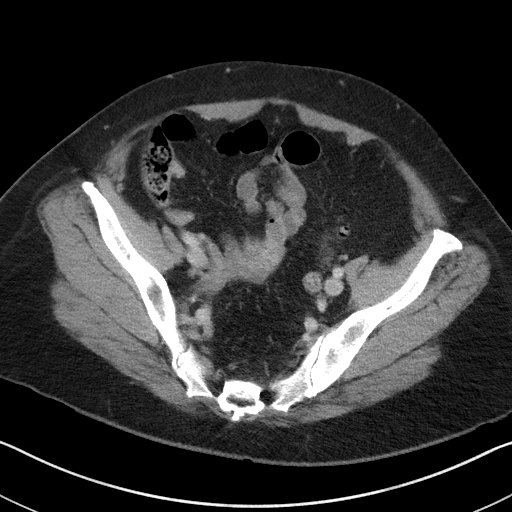
[im 32/91  soft-tissue]
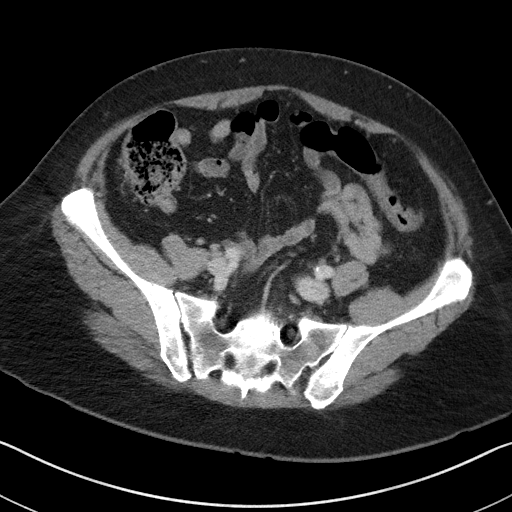
[im 38/91  soft-tissue]
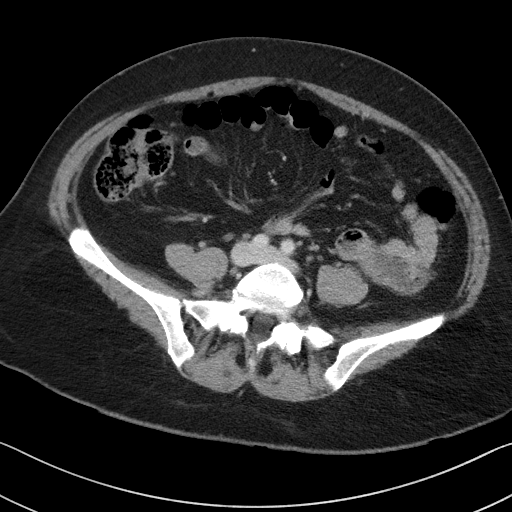
[im 48/91  soft-tissue]
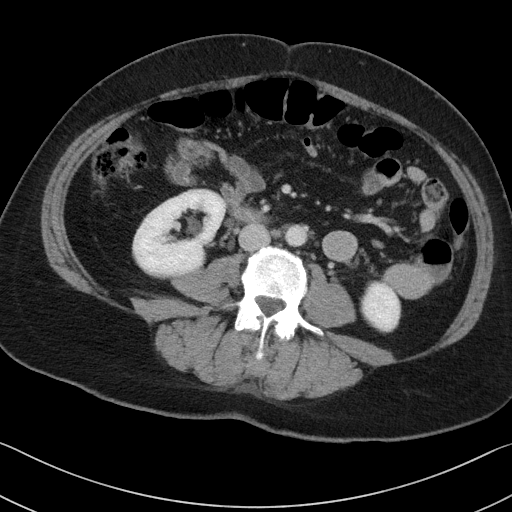
[im 53/91  soft-tissue]
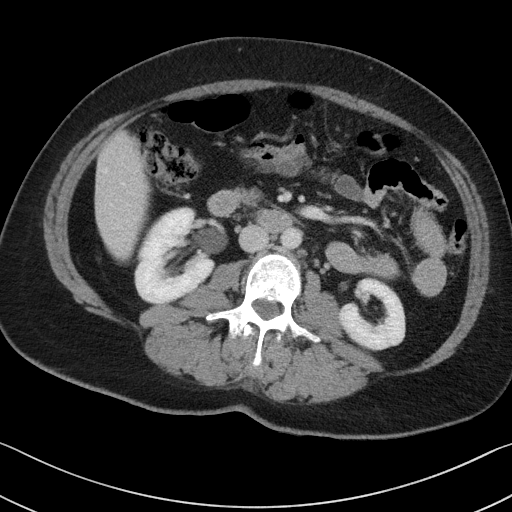
[im 59/91  soft-tissue]
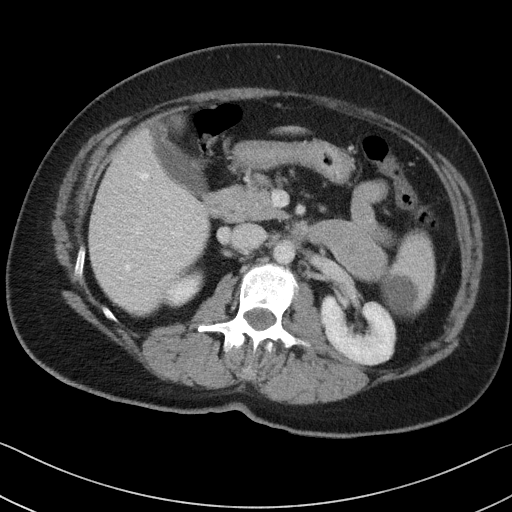
[im 59/91  bone]
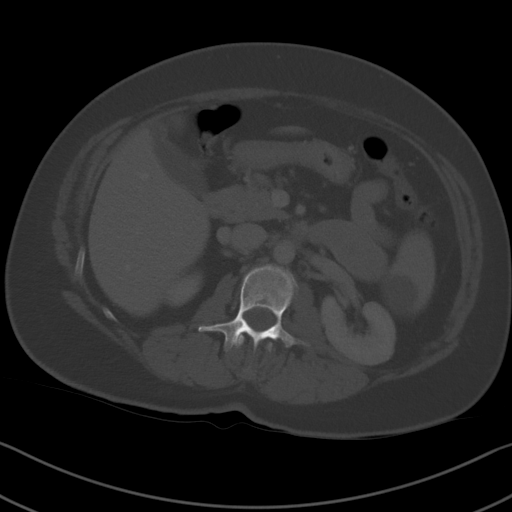
[im 64/91  soft-tissue]
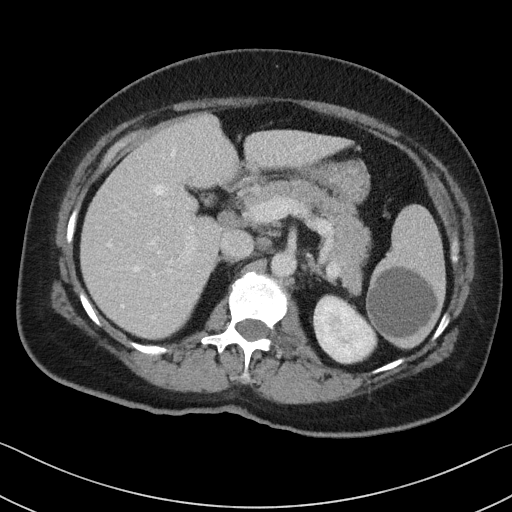
[im 69/91  soft-tissue]
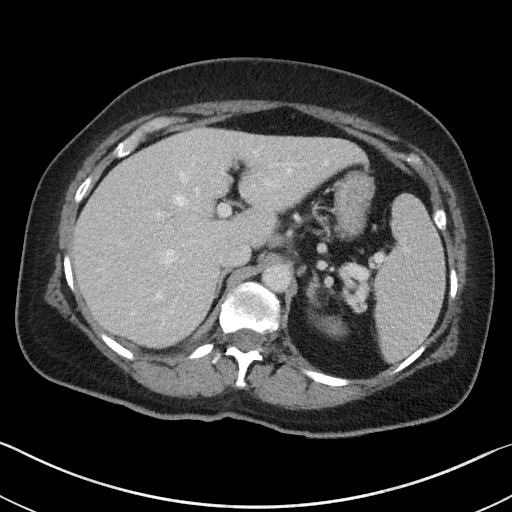
[im 80/91  soft-tissue]
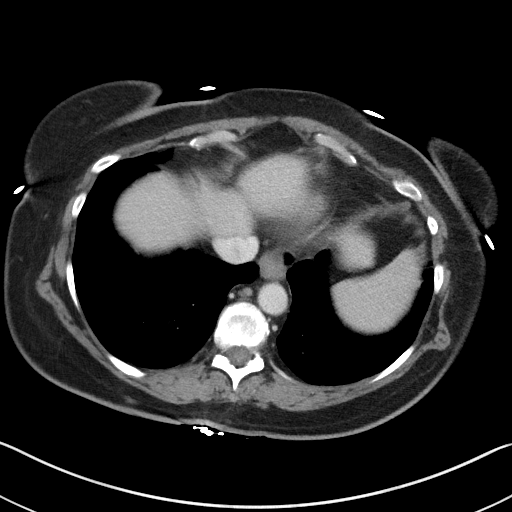
[im 85/91  soft-tissue]
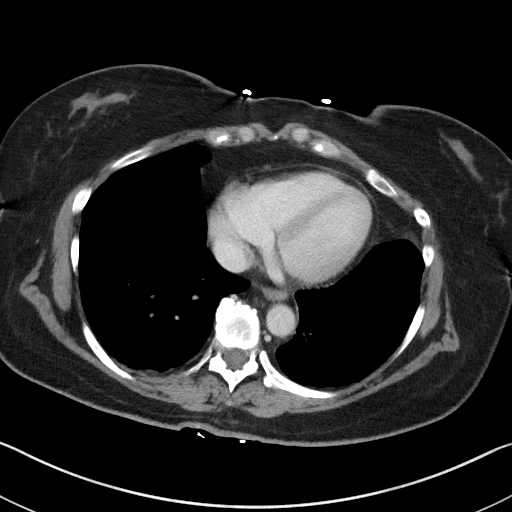

[Series 5: coronal st · coronal · 0.74mm/px · 3 of 101 slices shown]
[im 34/101  soft-tissue]
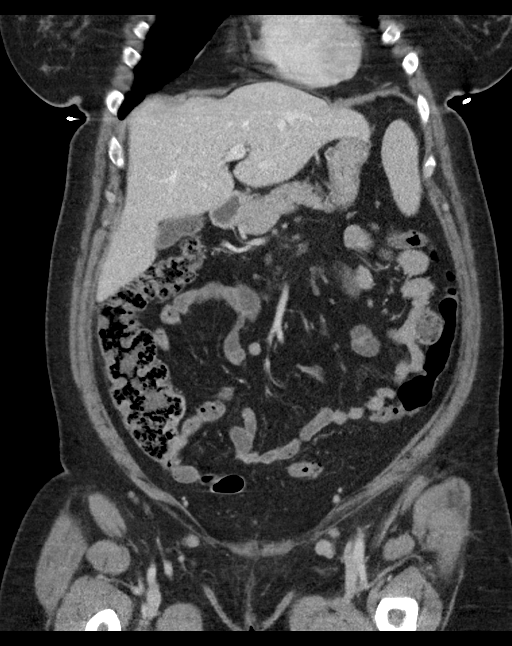
[im 45/101  soft-tissue]
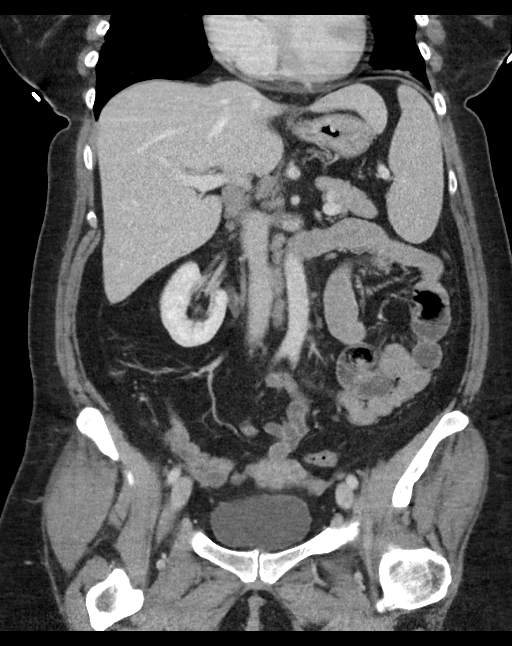
[im 56/101  soft-tissue]
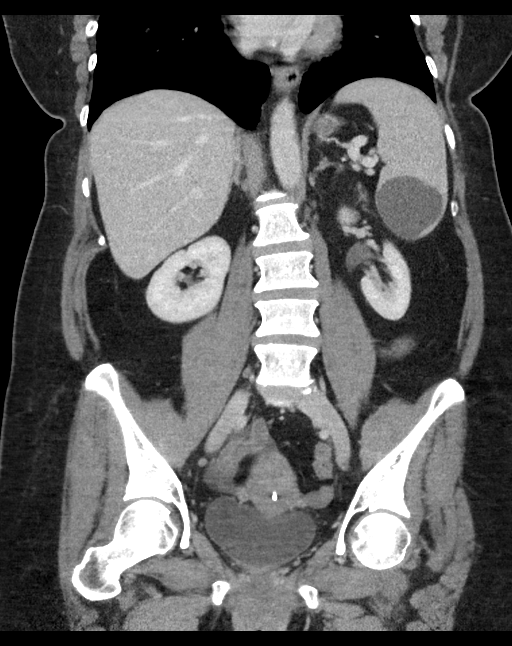

[16 of 46 positions shown; findings below may reference images not displayed]

FINDINGS: Lower chest: Mild dependent atelectatic changes are noted.

Hepatobiliary: No focal liver abnormality is seen. No gallstones,
gallbladder wall thickening, or biliary dilatation.

Pancreas: Unremarkable. No pancreatic ductal dilatation or
surrounding inflammatory changes.

Spleen: Spleen shows a large inferior cyst measuring 5.2 cm. This
appears benign in etiology.

Adrenals/Urinary Tract: Adrenal glands are unremarkable. Kidneys
demonstrate a normal enhancement pattern bilaterally. No definitive
renal calculi are seen. Bladder is partially distended. The ureters
are within normal limits.

Stomach/Bowel: Small hiatal hernia is noted. Stomach is otherwise
within normal limits. Scattered diverticular change of the colon is
noted without diverticulitis. The appendix is not well visualized.
No inflammatory changes are identified to suggest appendicitis.
Small bowel is within normal limits.

Vascular/Lymphatic: No significant vascular findings are present. No
enlarged abdominal or pelvic lymph nodes.

Reproductive: Uterus is within normal limits. Left adnexa is
unremarkable. A 2.9 cm simple cyst is noted within the right adnexa.
IUD is noted in place.

Other: No abdominal wall hernia or abnormality. No abdominopelvic
ascites.

Musculoskeletal: No acute or significant osseous findings.
IMPRESSION: Diverticulosis without diverticulitis.

Small hiatal hernia.

Large benign appearing splenic cyst.

2.9 cm simple cyst in the right ovary No follow-up imaging
recommended. Note: This recommendation does not apply to
premenarchal patients and to those with increased risk (genetic,
family history, elevated tumor markers or other high-risk factors)
of ovarian cancer. Reference: JACR [DATE]):[id number]

No other focal abnormality is noted.

## 2022-11-18 ENCOUNTER — Other Ambulatory Visit: Payer: Self-pay | Admitting: Family Medicine

## 2022-11-18 DIAGNOSIS — Z1231 Encounter for screening mammogram for malignant neoplasm of breast: Secondary | ICD-10-CM

## 2022-12-02 ENCOUNTER — Ambulatory Visit (INDEPENDENT_AMBULATORY_CARE_PROVIDER_SITE_OTHER): Payer: Commercial Managed Care - PPO | Admitting: Nurse Practitioner

## 2022-12-02 VITALS — BP 132/83 | Wt 187.4 lb

## 2022-12-02 DIAGNOSIS — Z1231 Encounter for screening mammogram for malignant neoplasm of breast: Secondary | ICD-10-CM

## 2022-12-02 DIAGNOSIS — F419 Anxiety disorder, unspecified: Secondary | ICD-10-CM

## 2022-12-02 DIAGNOSIS — G479 Sleep disorder, unspecified: Secondary | ICD-10-CM | POA: Diagnosis not present

## 2022-12-02 DIAGNOSIS — E669 Obesity, unspecified: Secondary | ICD-10-CM | POA: Insufficient documentation

## 2022-12-02 DIAGNOSIS — F418 Other specified anxiety disorders: Secondary | ICD-10-CM | POA: Diagnosis not present

## 2022-12-02 MED ORDER — PHENTERMINE HCL 37.5 MG PO TABS: 37.5000 mg | ORAL_TABLET | Freq: Every day | ORAL | 0 refills | Status: DC

## 2022-12-02 MED ORDER — ALPRAZOLAM 0.5 MG PO TABS: 0.5000 mg | ORAL_TABLET | Freq: Every evening | ORAL | 0 refills | Status: DC | PRN

## 2022-12-02 NOTE — Progress Notes (Unsigned)
Subjective:    Patient ID: Amanda Woodard, female    DOB: [birth year], 57 y.o.   MRN: [id number]  HPI Patient arrives to discuss fatigue, no energy. Patient states she is also gaining weight. Patient works as a Catering manager.  Due to her occupation, has frequent disruption and issues with her sleep patterns.  Takes Xanax very rarely for sleep about once a month.  Gets regular Pap smears and gynecological exams with her gynecologist in [place].  Does not check her BP outside of the office.  Very active lifestyle gets about 10-12,000 steps per day at work.  Her mammogram order has been sent in, patient plans to schedule this soon at [facility]. Has been under more stress lately due to family issues.  States she has been doing a lot of stress eating.  Has been more tearful than usual.  Denies suicidal or homicidal thoughts or ideation.  Denies any self-harm behaviors.   Would like medication to help jumpstart her weight loss.  Review of Systems  Constitutional:  Positive for fatigue.  HENT:  Negative for sore throat and trouble swallowing.   Respiratory:  Negative for cough, chest tightness and shortness of breath.   Cardiovascular:  Negative for chest pain and palpitations.  Psychiatric/Behavioral:  Positive for dysphoric mood and sleep disturbance. Negative for self-injury and suicidal ideas. The patient is nervous/anxious.       12/02/2022    9:32 AM  Depression screen PHQ 2/9  Decreased Interest 2  Down, Depressed, Hopeless 1  PHQ - 2 Score 3  Altered sleeping 2  Tired, decreased energy 3  Change in appetite 3  Feeling bad or failure about yourself  3  Trouble concentrating 1  Moving slowly or fidgety/restless 0  Suicidal thoughts 0  PHQ-9 Score 15  Difficult doing work/chores Somewhat difficult      12/02/2022    9:34 AM  GAD 7 : Generalized Anxiety Score  Nervous, Anxious, on Edge 0  Control/stop worrying 0  Worry too much - different things 1  Trouble relaxing 2   Restless 2  Easily annoyed or irritable 1  Afraid - awful might happen 0  Total GAD 7 Score 6  Anxiety Difficulty Somewhat difficult         Objective:   Physical Exam NAD.  Alert, oriented.  Thyroid nontender to palpation, no mass or goiter noted.  Lungs clear.  Heart regular rate rhythm.  Calm affect, tearful at times.  Making good eye contact.  Speech clear.  Dressed appropriately for the weather.  Thoughts logical coherent and relevant. Today's Vitals   12/02/22 0930  BP: 132/83  Weight: 187 lb 6.4 oz (85 kg)   Body mass index is 31.18 kg/m.        Assessment & Plan:  1. Obesity (BMI 30-39.9) Reviewed options for weight loss.  Will start with phentermine as directed.  Reviewed potential adverse effects.  Discontinue medication and contact office if any problems.  Continue active lifestyle.  Discussed healthy diet. - phentermine (ADIPEX-P) 37.5 MG tablet; Take 1 tablet (37.5 mg total) by mouth daily before breakfast.  Dispense: 30 tablet; Refill: 0  2. Sleep disturbance Continue occasional use of Xanax as needed for sleep. - ALPRAZolam (XANAX) 0.5 MG tablet; Take 1 tablet (0.5 mg total) by mouth at bedtime as needed for sleep.  Dispense: 30 tablet; Refill: 0  3. Encounter for screening mammogram for malignant neoplasm of breast Patient plans to schedule mammogram at local hospital.  4.  Depression with anxiety Defers counseling or medication for depression and anxiety at this point.  Patient to contact the office if she wishes to pursue options.  Return in about 1 month (around 01/02/2023) for video or in person. Call back sooner if needed.

## 2022-12-03 ENCOUNTER — Encounter: Payer: Self-pay | Admitting: Nurse Practitioner

## 2022-12-03 DIAGNOSIS — F418 Other specified anxiety disorders: Secondary | ICD-10-CM | POA: Insufficient documentation

## 2023-01-02 ENCOUNTER — Other Ambulatory Visit: Payer: Self-pay | Admitting: Nurse Practitioner

## 2023-01-02 DIAGNOSIS — E669 Obesity, unspecified: Secondary | ICD-10-CM

## 2023-01-03 ENCOUNTER — Other Ambulatory Visit: Payer: Self-pay | Admitting: Nurse Practitioner

## 2023-01-03 DIAGNOSIS — E669 Obesity, unspecified: Secondary | ICD-10-CM

## 2023-01-03 LAB — CBC WITH DIFFERENTIAL/PLATELET
Basophils Absolute: 0 10*3/uL (ref 0.0–0.2)
Basos: 1 %
EOS (ABSOLUTE): 0.5 10*3/uL — ABNORMAL HIGH (ref 0.0–0.4)
Eos: 8 %
Hematocrit: 48.4 % — ABNORMAL HIGH (ref 34.0–46.6)
Hemoglobin: 15.6 g/dL (ref 11.1–15.9)
Immature Grans (Abs): 0 10*3/uL (ref 0.0–0.1)
Immature Granulocytes: 0 %
Lymphocytes Absolute: 1.5 10*3/uL (ref 0.7–3.1)
Lymphs: 24 %
MCH: 30.9 pg (ref 26.6–33.0)
MCHC: 32.2 g/dL (ref 31.5–35.7)
MCV: 96 fL (ref 79–97)
Monocytes Absolute: 0.5 10*3/uL (ref 0.1–0.9)
Monocytes: 8 %
Neutrophils Absolute: 3.8 10*3/uL (ref 1.4–7.0)
Neutrophils: 59 %
Platelets: 260 10*3/uL (ref 150–450)
RBC: 5.05 x10E6/uL (ref 3.77–5.28)
RDW: 12.5 % (ref 11.7–15.4)
WBC: 6.4 10*3/uL (ref 3.4–10.8)

## 2023-01-03 LAB — TSH: TSH: 1.95 u[IU]/mL (ref 0.450–4.500)

## 2023-01-03 LAB — LIPID PANEL
Chol/HDL Ratio: 4.2 ratio (ref 0.0–4.4)
Cholesterol, Total: 193 mg/dL (ref 100–199)
HDL: 46 mg/dL (ref >39)
LDL Chol Calc (NIH): 123 mg/dL — ABNORMAL HIGH (ref 0–99)
Triglycerides: 134 mg/dL (ref 0–149)
VLDL Cholesterol Cal: 24 mg/dL (ref 5–40)

## 2023-01-03 LAB — COMPREHENSIVE METABOLIC PANEL
ALT: 23 IU/L (ref 0–32)
AST: 19 IU/L (ref 0–40)
Albumin/Globulin Ratio: 1.8 (ref 1.2–2.2)
Albumin: 4.3 g/dL (ref 3.8–4.9)
Alkaline Phosphatase: 56 IU/L (ref 44–121)
BUN/Creatinine Ratio: 19 (ref 9–23)
BUN: 17 mg/dL (ref 6–24)
Bilirubin Total: 0.4 mg/dL (ref 0.0–1.2)
CO2: 21 mmol/L (ref 20–29)
Calcium: 9.5 mg/dL (ref 8.7–10.2)
Chloride: 106 mmol/L (ref 96–106)
Creatinine, Ser: 0.89 mg/dL (ref 0.57–1.00)
Globulin, Total: 2.4 g/dL (ref 1.5–4.5)
Glucose: 95 mg/dL (ref 70–99)
Potassium: 4.5 mmol/L (ref 3.5–5.2)
Sodium: 141 mmol/L (ref 134–144)
Total Protein: 6.7 g/dL (ref 6.0–8.5)
eGFR: 77 mL/min/{1.73_m2} (ref >59)

## 2023-01-03 LAB — VITAMIN D 25 HYDROXY (VIT D DEFICIENCY, FRACTURES): Vit D, 25-Hydroxy: 27.1 ng/mL — ABNORMAL LOW (ref 30.0–100.0)

## 2023-01-06 ENCOUNTER — Telehealth (INDEPENDENT_AMBULATORY_CARE_PROVIDER_SITE_OTHER): Payer: Commercial Managed Care - PPO | Admitting: Nurse Practitioner

## 2023-01-06 DIAGNOSIS — E669 Obesity, unspecified: Secondary | ICD-10-CM

## 2023-01-06 DIAGNOSIS — Z6830 Body mass index (BMI) 30.0-30.9, adult: Secondary | ICD-10-CM

## 2023-01-06 MED ORDER — PHENTERMINE HCL 37.5 MG PO TABS: 37.5000 mg | ORAL_TABLET | Freq: Every day | ORAL | 2 refills | Status: DC

## 2023-01-06 NOTE — Progress Notes (Unsigned)
Subjective:    Patient ID: Amanda Woodard, female    DOB: [birth year], 57 y.o.   MRN: [id number]  HPI 1 month follow up for weight loss and recent lab results Virtual Visit via Video Note  I connected with Amanda Woodard on 01/06/23 at  9:00 AM EDT by a video enabled telemedicine application and verified that I am speaking with the correct person using two identifiers.  Location: Patient: home Provider: office   I discussed the limitations of evaluation and management by telemedicine and the availability of in person appointments. The patient expressed understanding and agreed to proceed.  History of Present Illness: Presents for follow-up after 1 month of phentermine use.  States she had some constipation the first week but use stool softeners and MiraLAX, this has resolved with no further problems.  Has increased her water intake.  Drinks 1 cup of coffee with creamer in the morning.  Eats a well-balanced diet.  Does not drink any sodas including diet sodas.  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  Denies any adverse effects such as palpitations chest pain or shortness of breath.  Reports her weight today at home 179 pounds.   Observations/Objective: Limited observation due to technical difficulties.  NAD.  Alert, oriented.  Calm cheerful affect. The 10-year ASCVD risk score (Arnett DK, et al., 2019) is: 2.2%   Values used to calculate the score:     Age: 69 years     Sex: Female     Is Non-Hispanic African American: No     Diabetic: No     Tobacco smoker: No     Systolic Blood Pressure: 132 mmHg     Is BP treated: No     HDL Cholesterol: 46 mg/dL     Total Cholesterol: 193 mg/dL Results for orders placed or performed in visit on 10/24/22  CBC with Differential  Result Value Ref Range   WBC 6.4 3.4 - 10.8 x10E3/uL   RBC 5.05 3.77 - 5.28 x10E6/uL   Hemoglobin 15.6 11.1 - 15.9 g/dL   Hematocrit 40.9 (H) 81.1 - 46.6 %   MCV 96 79 - 97 fL   MCH  30.9 26.6 - 33.0 pg   MCHC 32.2 31.5 - 35.7 g/dL   RDW 91.4 78.2 - 95.6 %   Platelets 260 150 - 450 x10E3/uL   Neutrophils 59 Not Estab. %   Lymphs 24 Not Estab. %   Monocytes 8 Not Estab. %   Eos 8 Not Estab. %   Basos 1 Not Estab. %   Neutrophils Absolute 3.8 1.4 - 7.0 x10E3/uL   Lymphocytes Absolute 1.5 0.7 - 3.1 x10E3/uL   Monocytes Absolute 0.5 0.1 - 0.9 x10E3/uL   EOS (ABSOLUTE) 0.5 (H) 0.0 - 0.4 x10E3/uL   Basophils Absolute 0.0 0.0 - 0.2 x10E3/uL   Immature Granulocytes 0 Not Estab. %   Immature Grans (Abs) 0.0 0.0 - 0.1 x10E3/uL  Comprehensive metabolic panel  Result Value Ref Range   Glucose 95 70 - 99 mg/dL   BUN 17 6 - 24 mg/dL   Creatinine, Ser 2.13 0.57 - 1.00 mg/dL   eGFR 77 >08 MV/HQI/6.96   BUN/Creatinine Ratio 19 9 - 23   Sodium 141 134 - 144 mmol/L   Potassium 4.5 3.5 - 5.2 mmol/L   Chloride 106 96 - 106 mmol/L   CO2 21 20 - 29 mmol/L   Calcium 9.5 8.7 - 10.2 mg/dL   Total Protein 6.7 6.0 -  8.5 g/dL   Albumin 4.3 3.8 - 4.9 g/dL   Globulin, Total 2.4 1.5 - 4.5 g/dL   Albumin/Globulin Ratio 1.8 1.2 - 2.2   Bilirubin Total 0.4 0.0 - 1.2 mg/dL   Alkaline Phosphatase 56 44 - 121 IU/L   AST 19 0 - 40 IU/L   ALT 23 0 - 32 IU/L  Lipid panel  Result Value Ref Range   Cholesterol, Total 193 100 - 199 mg/dL   Triglycerides 161 0 - 149 mg/dL   HDL 46 >09 mg/dL   VLDL Cholesterol Cal 24 5 - 40 mg/dL   LDL Chol Calc (NIH) 604 (H) 0 - 99 mg/dL   Chol/HDL Ratio 4.2 0.0 - 4.4 ratio  TSH  Result Value Ref Range   TSH 1.950 0.450 - 4.500 uIU/mL  Vitamin D, 25-hydroxy  Result Value Ref Range   Vit D, 25-Hydroxy 27.1 (L) 30.0 - 100.0 ng/mL   Reviewed recent labs from 01/02/2023 and ASCVD 10-year risk with patient.  She has access and is seeing the labs on MyChart account.  Assessment and Plan: Problem List Items Addressed This Visit       Other   Obesity (BMI 30-39.9)   Relevant Medications   phentermine (ADIPEX-P) 37.5 MG tablet   Meds ordered this  encounter  Medications   phentermine (ADIPEX-P) 37.5 MG tablet    Sig: Take 1 tablet (37.5 mg total) by mouth daily before breakfast.    Dispense:  30 tablet    Refill:  2   Continue phentermine as directed.  Plan to discontinue if weight loss plateaus.  Encourage patient to continue active lifestyle and healthy diet.  Follow Up Instructions: Follow-up in 3 months if she wishes to continue phentermine.    I discussed the assessment and treatment plan with the patient. The patient was provided an opportunity to ask questions and all were answered. The patient agreed with the plan and demonstrated an understanding of the instructions.   The patient was advised to call back or seek an in-person evaluation if the symptoms worsen or if the condition fails to improve as anticipated.  I provided 15 minutes of non-face-to-face time during this encounter.   Campbell Riches, NP        Objective:

## 2023-01-07 ENCOUNTER — Encounter: Payer: Self-pay | Admitting: Nurse Practitioner

## 2023-01-31 NOTE — Progress Notes (Signed)
Reviewed during visit on 01/27/23

## 2023-03-16 ENCOUNTER — Other Ambulatory Visit: Payer: Self-pay | Admitting: Nurse Practitioner

## 2023-03-16 DIAGNOSIS — G479 Sleep disorder, unspecified: Secondary | ICD-10-CM

## 2023-07-11 ENCOUNTER — Encounter: Payer: Self-pay | Admitting: Family Medicine

## 2023-09-22 ENCOUNTER — Telehealth: Payer: Self-pay | Admitting: Family Medicine

## 2023-09-22 NOTE — Telephone Encounter (Unsigned)
 Copied from CRM [id number]. Topic: Clinical - Request for Lab/Test Order >> Sep 22, 2023  2:08 PM Ivette P wrote: Reason for CRM: Patient called in to see about getting another mammogram done due to previous mammogram has an abnormal reading and needs prior authorization for the second mammogram. Pt has a 2:45 pm at [facility] on 01/08. Can call pt if needed [phone].

## 2023-09-25 NOTE — Telephone Encounter (Signed)
 Spoke with Mammo at [facility] in [place] - they stated the mammo was ordered by GYN and he received the report and request to sign for additional views and they have everything they need and she is al set  Left message to notify patient of information above

## 2023-09-25 NOTE — Telephone Encounter (Unsigned)
 Copied from CRM [id number]. Topic: Clinical - Request for Lab/Test Order >> Sep 22, 2023  2:08 PM Ivette P wrote: Reason for CRM: Patient called in to see about getting another mammogram done due to previous mammogram has an abnormal reading and needs prior authorization for the second mammogram. Pt has a 2:45 pm at [facility] on 01/08. Can call pt if needed [phone].

## 2023-09-25 NOTE — Telephone Encounter (Signed)
 Mauro Coy C, NP      As we discussed, we did not order the original or have the results of the screening mammogram or breast exam to get PA. I checked and this has always been done through her GYN. She will need to contact them ASAP to get PA for further studies. Thanks.

## 2023-09-25 NOTE — Telephone Encounter (Signed)
 Nurses Please work with patient We are willing to sign off on her doing additional imaging Please make sure she informs the mammogram center in [place] to send [place] results of these mammograms

## 2023-09-29 NOTE — Telephone Encounter (Signed)
 Patient had additional views scheduled 09/26/22 and report was being sent to ordering physician (her GYN)

## 2023-10-20 ENCOUNTER — Other Ambulatory Visit: Payer: Self-pay | Admitting: General Surgery

## 2023-10-20 DIAGNOSIS — D241 Benign neoplasm of right breast: Secondary | ICD-10-CM

## 2023-10-24 ENCOUNTER — Other Ambulatory Visit: Payer: Self-pay | Admitting: General Surgery

## 2023-10-24 DIAGNOSIS — D241 Benign neoplasm of right breast: Secondary | ICD-10-CM

## 2023-12-19 ENCOUNTER — Encounter (HOSPITAL_BASED_OUTPATIENT_CLINIC_OR_DEPARTMENT_OTHER): Payer: Self-pay | Admitting: General Surgery

## 2023-12-19 ENCOUNTER — Other Ambulatory Visit: Payer: Self-pay

## 2023-12-25 ENCOUNTER — Ambulatory Visit
Admission: RE | Admit: 2023-12-25 | Discharge: 2023-12-25 | Disposition: A | Discharge status: Home / Self Care | Payer: Commercial Managed Care - PPO | Source: Ambulatory Visit | Attending: General Surgery | Admitting: General Surgery

## 2023-12-25 DIAGNOSIS — D241 Benign neoplasm of right breast: Secondary | ICD-10-CM

## 2023-12-25 HISTORY — PX: BREAST BIOPSY: SHX20

## 2023-12-25 MED ORDER — CHLORHEXIDINE GLUCONATE CLOTH 2 % EX PADS
6.0000 | MEDICATED_PAD | Freq: Once | CUTANEOUS | Status: DC
Start: 2023-12-25 — End: 2023-12-26

## 2023-12-25 MED ORDER — CHLORHEXIDINE GLUCONATE CLOTH 2 % EX PADS: 6.0000 | MEDICATED_PAD | Freq: Once | CUTANEOUS | Status: DC

## 2023-12-25 MED ORDER — ENSURE PRE-SURGERY PO LIQD: 296.0000 mL | Freq: Once | ORAL | Status: DC

## 2023-12-25 NOTE — Progress Notes (Signed)

## 2023-12-26 ENCOUNTER — Other Ambulatory Visit: Payer: Self-pay

## 2023-12-26 ENCOUNTER — Ambulatory Visit (HOSPITAL_BASED_OUTPATIENT_CLINIC_OR_DEPARTMENT_OTHER): Admitting: Anesthesiology

## 2023-12-26 ENCOUNTER — Encounter (HOSPITAL_BASED_OUTPATIENT_CLINIC_OR_DEPARTMENT_OTHER): Payer: Self-pay | Admitting: General Surgery

## 2023-12-26 ENCOUNTER — Ambulatory Visit
Admission: RE | Admit: 2023-12-26 | Discharge: 2023-12-26 | Disposition: A | Discharge status: Home / Self Care | Payer: Commercial Managed Care - PPO | Source: Ambulatory Visit | Attending: General Surgery | Admitting: General Surgery

## 2023-12-26 ENCOUNTER — Ambulatory Visit (HOSPITAL_BASED_OUTPATIENT_CLINIC_OR_DEPARTMENT_OTHER)
Admission: RE | Admit: 2023-12-26 | Discharge: 2023-12-26 | Disposition: A | Discharge status: Home / Self Care | Payer: Commercial Managed Care - PPO | Attending: General Surgery | Admitting: General Surgery

## 2023-12-26 ENCOUNTER — Encounter (HOSPITAL_BASED_OUTPATIENT_CLINIC_OR_DEPARTMENT_OTHER)
Admission: RE | Disposition: A | Discharge status: Home / Self Care | Payer: Self-pay | Source: Home / Self Care | Attending: General Surgery

## 2023-12-26 DIAGNOSIS — N6041 Mammary duct ectasia of right breast: Secondary | ICD-10-CM | POA: Diagnosis not present

## 2023-12-26 DIAGNOSIS — E66813 Obesity, class 3: Secondary | ICD-10-CM | POA: Insufficient documentation

## 2023-12-26 DIAGNOSIS — D241 Benign neoplasm of right breast: Secondary | ICD-10-CM

## 2023-12-26 DIAGNOSIS — Z01818 Encounter for other preprocedural examination: Secondary | ICD-10-CM

## 2023-12-26 DIAGNOSIS — Z6831 Body mass index (BMI) 31.0-31.9, adult: Secondary | ICD-10-CM | POA: Diagnosis not present

## 2023-12-26 DIAGNOSIS — N6310 Unspecified lump in the right breast, unspecified quadrant: Secondary | ICD-10-CM | POA: Diagnosis not present

## 2023-12-26 HISTORY — DX: Anxiety disorder, unspecified: F41.9

## 2023-12-26 HISTORY — PX: RADIOACTIVE SEED GUIDED EXCISIONAL BREAST BIOPSY: SHX6490

## 2023-12-26 LAB — POCT PREGNANCY, URINE: Preg Test, Ur: NEGATIVE

## 2023-12-26 SURGERY — RADIOACTIVE SEED GUIDED BREAST BIOPSY
Anesthesia: General | Site: Breast | Laterality: Right

## 2023-12-26 MED ORDER — CEFAZOLIN SODIUM-DEXTROSE 2-4 GM/100ML-% IV SOLN
INTRAVENOUS | Status: AC
  Filled 2023-12-26: qty 100

## 2023-12-26 MED ORDER — OXYCODONE HCL 5 MG PO TABS: 5.0000 mg | ORAL_TABLET | Freq: Once | ORAL | Status: DC | PRN

## 2023-12-26 MED ORDER — KETOROLAC TROMETHAMINE 15 MG/ML IJ SOLN
15.0000 mg | INTRAMUSCULAR | Status: AC
  Administered 2023-12-26: 15 mg via INTRAVENOUS

## 2023-12-26 MED ORDER — LIDOCAINE HCL (CARDIAC) PF 100 MG/5ML IV SOSY
PREFILLED_SYRINGE | INTRAVENOUS | Status: DC | PRN
  Administered 2023-12-26: 60 mg via INTRAVENOUS

## 2023-12-26 MED ORDER — FENTANYL CITRATE (PF) 100 MCG/2ML IJ SOLN
INTRAMUSCULAR | Status: AC
  Filled 2023-12-26: qty 2

## 2023-12-26 MED ORDER — PROPOFOL 10 MG/ML IV BOLUS
INTRAVENOUS | Status: DC | PRN
  Administered 2023-12-26: 180 mg via INTRAVENOUS

## 2023-12-26 MED ORDER — BUPIVACAINE HCL (PF) 0.25 % IJ SOLN
INTRAMUSCULAR | Status: AC
  Filled 2023-12-26: qty 30

## 2023-12-26 MED ORDER — DEXAMETHASONE SODIUM PHOSPHATE 10 MG/ML IJ SOLN
INTRAMUSCULAR | Status: AC
  Filled 2023-12-26: qty 1

## 2023-12-26 MED ORDER — DEXAMETHASONE SODIUM PHOSPHATE 10 MG/ML IJ SOLN
INTRAMUSCULAR | Status: DC | PRN
Start: 2023-12-26 — End: 2023-12-26
  Administered 2023-12-26: 10 mg via INTRAVENOUS

## 2023-12-26 MED ORDER — MIDAZOLAM HCL 5 MG/5ML IJ SOLN
INTRAMUSCULAR | Status: DC | PRN
Start: 2023-12-26 — End: 2023-12-26
  Administered 2023-12-26: 2 mg via INTRAVENOUS

## 2023-12-26 MED ORDER — ACETAMINOPHEN 500 MG PO TABS
ORAL_TABLET | ORAL | Status: AC
  Filled 2023-12-26: qty 2

## 2023-12-26 MED ORDER — FENTANYL CITRATE (PF) 100 MCG/2ML IJ SOLN
INTRAMUSCULAR | Status: DC | PRN
  Administered 2023-12-26: 25 ug via INTRAVENOUS
  Administered 2023-12-26: 50 ug via INTRAVENOUS

## 2023-12-26 MED ORDER — OXYCODONE HCL 5 MG/5ML PO SOLN: 5.0000 mg | Freq: Once | ORAL | Status: DC | PRN

## 2023-12-26 MED ORDER — ONDANSETRON HCL 4 MG/2ML IJ SOLN
INTRAMUSCULAR | Status: AC
  Filled 2023-12-26: qty 2

## 2023-12-26 MED ORDER — ONDANSETRON HCL 4 MG/2ML IJ SOLN: 4.0000 mg | Freq: Four times a day (QID) | INTRAMUSCULAR | Status: DC | PRN

## 2023-12-26 MED ORDER — CEFAZOLIN SODIUM-DEXTROSE 2-4 GM/100ML-% IV SOLN
2.0000 g | INTRAVENOUS | Status: AC
  Administered 2023-12-26: 2 g via INTRAVENOUS

## 2023-12-26 MED ORDER — ONDANSETRON HCL 4 MG/2ML IJ SOLN
INTRAMUSCULAR | Status: DC | PRN
Start: 2023-12-26 — End: 2023-12-26
  Administered 2023-12-26: 4 mg via INTRAVENOUS

## 2023-12-26 MED ORDER — LIDOCAINE 2% (20 MG/ML) 5 ML SYRINGE
INTRAMUSCULAR | Status: AC
  Filled 2023-12-26: qty 5

## 2023-12-26 MED ORDER — KETOROLAC TROMETHAMINE 15 MG/ML IJ SOLN
INTRAMUSCULAR | Status: AC
  Filled 2023-12-26: qty 1

## 2023-12-26 MED ORDER — LACTATED RINGERS IV SOLN: INTRAVENOUS | Status: DC

## 2023-12-26 MED ORDER — MIDAZOLAM HCL 2 MG/2ML IJ SOLN
INTRAMUSCULAR | Status: AC
  Filled 2023-12-26: qty 2

## 2023-12-26 MED ORDER — BUPIVACAINE HCL (PF) 0.25 % IJ SOLN
INTRAMUSCULAR | Status: DC | PRN
  Administered 2023-12-26: 10 mL

## 2023-12-26 MED ORDER — PROPOFOL 500 MG/50ML IV EMUL
INTRAVENOUS | Status: DC | PRN
  Administered 2023-12-26: 150 ug/kg/min via INTRAVENOUS

## 2023-12-26 MED ORDER — FENTANYL CITRATE (PF) 100 MCG/2ML IJ SOLN: 25.0000 ug | INTRAMUSCULAR | Status: DC | PRN

## 2023-12-26 MED ORDER — ACETAMINOPHEN 500 MG PO TABS: 1000.0000 mg | ORAL_TABLET | ORAL | Status: DC

## 2023-12-26 MED ORDER — PROPOFOL 10 MG/ML IV BOLUS
INTRAVENOUS | Status: AC
Start: 2023-12-26 — End: ?
  Filled 2023-12-26: qty 20

## 2023-12-26 SURGICAL SUPPLY — 48 items
APPLIER CLIP 9.375 MED OPEN (MISCELLANEOUS) IMPLANT
BINDER BREAST LRG (GAUZE/BANDAGES/DRESSINGS) IMPLANT
BINDER BREAST MEDIUM (GAUZE/BANDAGES/DRESSINGS) IMPLANT
BINDER BREAST XLRG (GAUZE/BANDAGES/DRESSINGS) IMPLANT
BINDER BREAST XXLRG (GAUZE/BANDAGES/DRESSINGS) IMPLANT
BLADE SURG 15 STRL LF DISP TIS (BLADE) ×1 IMPLANT
CANISTER SUC SOCK COL 7IN (MISCELLANEOUS) IMPLANT
CANISTER SUCT 1200ML W/VALVE (MISCELLANEOUS) IMPLANT
CHLORAPREP W/TINT 26 (MISCELLANEOUS) ×1 IMPLANT
CLIP APPLIE 9.375 MED OPEN (MISCELLANEOUS) IMPLANT
CLIP TI WIDE RED SMALL 6 (CLIP) IMPLANT
COVER BACK TABLE 60X90IN (DRAPES) ×1 IMPLANT
COVER MAYO STAND STRL (DRAPES) ×1 IMPLANT
COVER PROBE CYLINDRICAL 5X96 (MISCELLANEOUS) ×1 IMPLANT
DERMABOND ADVANCED .7 DNX12 (GAUZE/BANDAGES/DRESSINGS) ×1 IMPLANT
DRAPE LAPAROSCOPIC ABDOMINAL (DRAPES) ×1 IMPLANT
DRAPE UTILITY XL STRL (DRAPES) ×1 IMPLANT
DRSG TEGADERM 4X4.75 (GAUZE/BANDAGES/DRESSINGS) IMPLANT
ELECT COATED BLADE 2.86 ST (ELECTRODE) ×1 IMPLANT
ELECT REM PT RETURN 9FT ADLT (ELECTROSURGICAL) ×1 IMPLANT
ELECTRODE REM PT RTRN 9FT ADLT (ELECTROSURGICAL) ×1 IMPLANT
GAUZE SPONGE 4X4 12PLY STRL LF (GAUZE/BANDAGES/DRESSINGS) IMPLANT
GLOVE BIO SURGEON STRL SZ7 (GLOVE) ×2 IMPLANT
GLOVE BIOGEL PI IND STRL 6.5 (GLOVE) IMPLANT
GLOVE BIOGEL PI IND STRL 7.5 (GLOVE) ×1 IMPLANT
GOWN STRL REUS W/ TWL LRG LVL3 (GOWN DISPOSABLE) ×2 IMPLANT
HEMOSTAT ARISTA ABSORB 3G PWDR (HEMOSTASIS) IMPLANT
KIT MARKER MARGIN INK (KITS) ×1 IMPLANT
NDL HYPO 25X1 1.5 SAFETY (NEEDLE) ×1 IMPLANT
NEEDLE HYPO 25X1 1.5 SAFETY (NEEDLE) ×1 IMPLANT
NS IRRIG 1000ML POUR BTL (IV SOLUTION) IMPLANT
PACK BASIN DAY SURGERY FS (CUSTOM PROCEDURE TRAY) ×1 IMPLANT
PENCIL SMOKE EVACUATOR (MISCELLANEOUS) ×1 IMPLANT
RETRACTOR ONETRAX LX 90X20 (MISCELLANEOUS) IMPLANT
SLEEVE SCD COMPRESS KNEE MED (STOCKING) ×1 IMPLANT
SPIKE FLUID TRANSFER (MISCELLANEOUS) IMPLANT
SPONGE T-LAP 4X18 ~~LOC~~+RFID (SPONGE) ×1 IMPLANT
STRIP CLOSURE SKIN 1/2X4 (GAUZE/BANDAGES/DRESSINGS) ×1 IMPLANT
SUT MNCRL AB 4-0 PS2 18 (SUTURE) IMPLANT
SUT MON AB 5-0 PS2 18 (SUTURE) IMPLANT
SUT SILK 2 0 SH (SUTURE) IMPLANT
SUT VIC AB 2-0 SH 27XBRD (SUTURE) ×1 IMPLANT
SUT VIC AB 3-0 SH 27X BRD (SUTURE) ×1 IMPLANT
SYR CONTROL 10ML LL (SYRINGE) ×1 IMPLANT
TOWEL GREEN STERILE FF (TOWEL DISPOSABLE) ×1 IMPLANT
TRAY FAXITRON CT DISP (TRAY / TRAY PROCEDURE) ×1 IMPLANT
TUBE CONNECTING 20X1/4 (TUBING) IMPLANT
YANKAUER SUCT BULB TIP NO VENT (SUCTIONS) IMPLANT

## 2023-12-26 NOTE — Op Note (Signed)
 Preoperative diagnosis: Right breast mass Postoperative diagnosis: Same as above Procedure: Right breast seed guided excisional biopsy Surgeon: Dr. Harden Mo Anesthesia: General Estimated blood loss: Minimal Complications: None Drains: None Specimens: Right breast mass containing seed and clip marked with paint Disposition to recovery stable addition  Indications: This a 58 year old female who noticed some intermittent clear spontaneous discharge for about 8 months.  Mammogram showed a mass in the right breast that measured 1.1 cm.  On biopsy this was a papilloma.  We discussed excision.  Procedure: After informed consent was obtained she was taken to the operating room.  She had had the seed placed prior to this and I had these mammograms available for my review.  She had SCDs in place.  She was given antibiotics.  She was placed under general anesthesia without complication.  She was prepped and draped in standard sterile surgical fashion.  A surgical timeout was then performed.  I infiltrated Marcaine throughout the retroareolar region.  I then made a periareolar incision or hide the scar later.  I tunneled to the seed.  I remove the tissue anterior to that as the clip was anterior to the seed.  I remove this and passed it off the table after marking it with paint.  A mammogram confirmed removal of the seed and the clip.  I then obtained hemostasis.  I closed the breast tissue with 2-0 Vicryl.  The skin was closed with 3-0 Vicryl and 5-0 Monocryl.  Glue and Steri-Strips were applied.  She tolerated this well was transferred recovery stable.

## 2023-12-26 NOTE — Anesthesia Procedure Notes (Signed)
 Procedure Name: LMA Insertion Date/Time: 12/26/2023 7:37 AM  Performed by: Thornell Mule, CRNAPre-anesthesia Checklist: Patient identified, Emergency Drugs available, Suction available and Patient being monitored Patient Re-evaluated:Patient Re-evaluated prior to induction Oxygen Delivery Method: Circle system utilized Preoxygenation: Pre-oxygenation with 100% oxygen Induction Type: IV induction Ventilation: Mask ventilation without difficulty LMA: LMA inserted LMA Size: 4.0 Number of attempts: 1 Placement Confirmation: positive ETCO2 Tube secured with: Tape Dental Injury: Teeth and Oropharynx as per pre-operative assessment

## 2023-12-26 NOTE — Interval H&P Note (Signed)
 History and Physical Interval Note:  12/26/2023 7:12 AM  Amanda Woodard  has presented today for surgery, with the diagnosis of RIGHT BREAST MASS.  The various methods of treatment have been discussed with the patient and family. After consideration of risks, benefits and other options for treatment, the patient has consented to  Procedure(s) with comments: RIGHT BREAST SEED GUIDED EXCISIONAL BIOPSY (Right) - LMA, right as a surgical intervention.  The patient's history has been reviewed, patient examined, no change in status, stable for surgery.  I have reviewed the patient's chart and labs.  Questions were answered to the patient's satisfaction.     [name]

## 2023-12-26 NOTE — Anesthesia Preprocedure Evaluation (Addendum)
 Anesthesia Evaluation  Patient identified by MRN, date of birth, ID band Patient awake    Reviewed: Allergy & Precautions, H&P , NPO status , Patient's Chart, lab work & pertinent test results  Airway Mallampati: II   Neck ROM: full    Dental   Pulmonary neg pulmonary ROS   breath sounds clear to auscultation       Cardiovascular negative cardio ROS  Rhythm:regular Rate:Normal     Neuro/Psych  PSYCHIATRIC DISORDERS Anxiety Depression       GI/Hepatic   Endo/Other    Class 3 obesity  Renal/GU      Musculoskeletal   Abdominal   Peds  Hematology   Anesthesia Other Findings   Reproductive/Obstetrics                             Anesthesia Physical Anesthesia Plan  ASA: 2  Anesthesia Plan: General   Post-op Pain Management:    Induction: Intravenous  PONV Risk Score and Plan: 3 and Ondansetron, Dexamethasone, Midazolam and Treatment may vary due to age or medical condition  Airway Management Planned: LMA  Additional Equipment:   Intra-op Plan:   Post-operative Plan: Extubation in OR  Informed Consent: I have reviewed the patients History and Physical, chart, labs and discussed the procedure including the risks, benefits and alternatives for the proposed anesthesia with the patient or authorized representative who has indicated his/her understanding and acceptance.     Dental advisory given  Plan Discussed with: CRNA, Anesthesiologist and Surgeon  Anesthesia Plan Comments:        Anesthesia Quick Evaluation

## 2023-12-26 NOTE — Transfer of Care (Signed)
 Immediate Anesthesia Transfer of Care Note  Patient: Amanda Woodard  Procedure(s) Performed: RIGHT BREAST SEED GUIDED EXCISIONAL BIOPSY (Right: Breast)  Patient Location: PACU  Anesthesia Type:General  Level of Consciousness: drowsy, patient cooperative, and responds to stimulation  Airway & Oxygen Therapy: Patient Spontanous Breathing and Patient connected to face mask oxygen  Post-op Assessment: Report given to RN and Post -op Vital signs reviewed and stable  Post vital signs: Reviewed and stable  Last Vitals:  Vitals Value Taken Time  BP    Temp    Pulse 96 12/26/23 0815  Resp    SpO2 99 % 12/26/23 0815  Vitals shown include unfiled device data.  Last Pain:  Vitals:   12/26/23 0454  TempSrc: Temporal  PainSc: 0-No pain      Patients Stated Pain Goal: 3 (12/26/23 0981)  Complications: No notable events documented.

## 2023-12-26 NOTE — H&P (Signed)
 58 year old female who has no prior breast history. She has a family history of DCIS in her mom at age 51 and a maternal aunt in her 37s as well. She has noticed some intermittent clear spontaneous right nipple discharge for about 8 months. Last was a couple months of ago. She was evaluated with mammography. She has B density breast tissue. There is a mass on the right breast that on ultrasound measures 1.1 cm. On biopsy this is a papilloma with sclerosis, calcification, and infarction. She is referred for evaluation. She works as a Financial controller.  Review of Systems: A complete review of systems was obtained from the patient. I have reviewed this information and discussed as appropriate with the patient. See HPI as well for other ROS.  Review of Systems  All other systems reviewed and are negative.  Medical History: History reviewed. No pertinent past medical history.  Past Surgical History:  Procedure Laterality Date  CLOSURE ENTEROCOLIC FISTULA  INCISION & DRAINAGE ABSCESS PERIRECTAL   No Known Allergies  Current Outpatient Medications on File Prior to Visit  Medication Sig Dispense Refill  ALPRAZolam (XANAX) 0.5 MG tablet Take 0.5 mg by mouth at bedtime as needed for Sleep  Bacillus subtilis-inulin 1.5 billion cell-1 gram Chew Take 1 tablet by mouth once daily  cholecalciferol (VITAMIN D3) 2,000 unit capsule Take 1 capsule by mouth once daily  diclofenac (VOLTAREN) 1 % topical gel Apply topically  fexofenadine (ALLEGRA) 180 MG tablet Take 180 mg by mouth once daily  ibuprofen (MOTRIN) 100 mg/5 mL suspension Take by mouth every 6 (six) hours as needed for Fever  phentermine (ADIPEX-P) 37.5 mg tablet Take 37.5 mg by mouth  Lactobac no.41/Bifidobact no.7 (PROBIOTIC-10 ORAL) Take by mouth (Patient not taking: Reported on 10/20/2023)  polyethylene glycol (MIRALAX) powder Take 17 g by mouth once daily Mix in 4-8ounces of fluid prior to taking. (Patient not taking: Reported on 10/20/2023)     Family History  Problem Relation Age of Onset  Breast cancer Mother  Stroke Mother  Obesity Mother  Diabetes Mother  Obesity Father  Coronary Artery Disease (Blocked arteries around heart) Father  High blood pressure (Hypertension) Brother  Breast cancer Maternal Aunt    Social History   Tobacco Use  Smoking Status Never  Smokeless Tobacco Never  Marital status: Married  Tobacco Use  Smoking status: Never  Smokeless tobacco: Never  Substance and Sexual Activity  Alcohol use: Yes  Comment: 1/2 glasses per day  Drug use: Never    Objective:   Body mass index is 31.01 kg/m.  Physical Exam Vitals reviewed.  Constitutional:  Appearance: Normal appearance.  Chest:  Breasts: Right: No inverted nipple, mass or nipple discharge.  Left: No inverted nipple, mass or nipple discharge.  Lymphadenopathy:  Upper Body:  Right upper body: No supraclavicular or axillary adenopathy.  Left upper body: No supraclavicular or axillary adenopathy.  Neurological:  Mental Status: She is alert.     Assessment and Plan:   Intraductal papilloma of right breast  Right breast mass seed guided excisional biopsy  We discussed the pathology as well as the mammographic and ultrasound findings. This area does appear to be benign. We discussed following this versus surgery. I think with the discharge this area should be excised and that is what I recommended. She also desires a reduction but I think the safest plan would be to do this first and then she could go have a reduction at a later date. We discussed a seed guided  excisional biopsy with the risks and recovery with that. Will plan to schedule her.

## 2023-12-26 NOTE — Discharge Instructions (Addendum)
 [facility] Office Phone Number [phone]  POST OP INSTRUCTIONS Take 400 mg of ibuprofen every 8 hours or 650 mg tylenol every 6 hours for next 72 hours then as needed.  May take Tylenol whenever desired. May take ibuprofen or aleve after 3pm. Use ice several times daily also.  A prescription for pain medication may be given to you upon discharge.  Take your pain medication as prescribed, if needed.  If narcotic pain medicine is not needed, then you may take acetaminophen (Tylenol), naprosyn (Alleve) or ibuprofen (Advil) as needed. Take your usually prescribed medications unless otherwise directed If you need a refill on your pain medication, please contact your pharmacy.  They will contact our office to request authorization.  Prescriptions will not be filled after 5pm or on week-ends. You should eat very light the first 24 hours after surgery, such as soup, crackers, pudding, etc.  Resume your normal diet the day after surgery. Most patients will experience some swelling and bruising in the breast.  Ice packs and a good support bra will help.  Wear the breast binder provided or a sports bra for 72 hours day and night.  After that wear a sports bra during the day until you return to the office. Swelling and bruising can take several days to resolve.  It is common to experience some constipation if taking pain medication after surgery.  Increasing fluid intake and taking a stool softener will usually help or prevent this problem from occurring.  A mild laxative (Milk of Magnesia or Miralax) should be taken according to package directions if there are no bowel movements after 48 hours. I used skin glue on the incision, you may shower in 24 hours.  The glue will flake off over the next 2-3 weeks.  Any sutures or staples will be removed at the office during your follow-up visit. ACTIVITIES:  You may resume regular daily activities (gradually increasing) beginning the next day.  Wearing  a good support bra or sports bra minimizes pain and swelling.  You may have sexual intercourse when it is comfortable. You may drive when you no longer are taking prescription pain medication, you can comfortably wear a seatbelt, and you can safely maneuver your car and apply brakes. RETURN TO WORK:  ______________________________________________________________________________________ Amanda Woodard should see your doctor in the office for a follow-up appointment approximately two weeks after your surgery.  Your doctor's nurse will typically make your follow-up appointment when she calls you with your pathology report.  Expect your pathology report 3-4 business days after your surgery.  You may call to check if you do not hear from [place] after three days. OTHER INSTRUCTIONS: _______________________________________________________________________________________________ _____________________________________________________________________________________________________________________________________ _____________________________________________________________________________________________________________________________________ _____________________________________________________________________________________________________________________________________  WHEN TO CALL DR [place]KEFIELD: Fever over 101.0 Nausea and/or vomiting. Extreme swelling or bruising. Continued bleeding from incision. Increased pain, redness, or drainage from the incision.  The clinic staff is available to answer your questions during regular business hours.  Please don't hesitate to call and ask to speak to one of the nurses for clinical concerns.  If you have a medical emergency, go to the nearest emergency room or call 911.  A surgeon from [facility] is always on call at the hospital.  For further questions, please visit [email or web address]   Post Anesthesia Home Care Instructions  Activity: Get plenty  of rest for the remainder of the day. A responsible individual must stay with you for 24 hours following the procedure.  For the next 24 hours, DO NOT: -Drive  a car -Advertising copywriter -Drink alcoholic beverages -Take any medication unless instructed by your physician -Make any legal decisions or sign important papers.  Meals: Start with liquid foods such as gelatin or soup. Progress to regular foods as tolerated. Avoid greasy, spicy, heavy foods. If nausea and/or vomiting occur, drink only clear liquids until the nausea and/or vomiting subsides. Call your physician if vomiting continues.  Special Instructions/Symptoms: Your throat may feel dry or sore from the anesthesia or the breathing tube placed in your throat during surgery. If this causes discomfort, gargle with warm salt water. The discomfort should disappear within 24 hours.  If you had a scopolamine patch placed behind your ear for the management of post- operative nausea and/or vomiting:  1. The medication in the patch is effective for 72 hours, after which it should be removed.  Wrap patch in a tissue and discard in the trash. Wash hands thoroughly with soap and water. 2. You may remove the patch earlier than 72 hours if you experience unpleasant side effects which may include dry mouth, dizziness or visual disturbances. 3. Avoid touching the patch. Wash your hands with soap and water after contact with the patch.

## 2023-12-27 NOTE — Anesthesia Postprocedure Evaluation (Signed)
 Anesthesia Post Note  Patient: Amanda Woodard  Procedure(s) Performed: RIGHT BREAST SEED GUIDED EXCISIONAL BIOPSY (Right: Breast)     Patient location during evaluation: PACU Anesthesia Type: General Level of consciousness: awake and alert Pain management: pain level controlled Vital Signs Assessment: post-procedure vital signs reviewed and stable Respiratory status: spontaneous breathing, nonlabored ventilation, respiratory function stable and patient connected to nasal cannula oxygen Cardiovascular status: blood pressure returned to baseline and stable Postop Assessment: no apparent nausea or vomiting Anesthetic complications: no   No notable events documented.  Last Vitals:  Vitals:   12/26/23 0845 12/26/23 0858  BP: 116/79 118/64  Pulse: 84 84  Resp: 16 20  Temp:  (!) 36.2 C  SpO2: 95% 95%    Last Pain:  Vitals:   12/27/23 0826  TempSrc:   PainSc: 1                  Stevey Stapleton S

## 2023-12-28 ENCOUNTER — Encounter (HOSPITAL_BASED_OUTPATIENT_CLINIC_OR_DEPARTMENT_OTHER): Payer: Self-pay | Admitting: General Surgery

## 2023-12-28 LAB — SURGICAL PATHOLOGY

## 2024-03-02 ENCOUNTER — Other Ambulatory Visit: Payer: Self-pay

## 2024-03-02 ENCOUNTER — Encounter: Payer: Self-pay | Admitting: Emergency Medicine

## 2024-03-02 ENCOUNTER — Ambulatory Visit: Admission: EM | Admit: 2024-03-02 | Discharge: 2024-03-02 | Disposition: A | Discharge status: Home / Self Care

## 2024-03-02 DIAGNOSIS — J309 Allergic rhinitis, unspecified: Secondary | ICD-10-CM

## 2024-03-02 DIAGNOSIS — J208 Acute bronchitis due to other specified organisms: Secondary | ICD-10-CM | POA: Diagnosis not present

## 2024-03-02 MED ORDER — PREDNISONE 20 MG PO TABS: 40.0000 mg | ORAL_TABLET | Freq: Every day | ORAL | 0 refills | Status: DC

## 2024-03-02 MED ORDER — PROMETHAZINE-DM 6.25-15 MG/5ML PO SYRP: 5.0000 mL | ORAL_SOLUTION | Freq: Four times a day (QID) | ORAL | 0 refills | Status: DC | PRN

## 2024-03-02 MED ORDER — AZELASTINE HCL 0.1 % NA SOLN: 1.0000 | Freq: Two times a day (BID) | NASAL | 2 refills | Status: DC

## 2024-03-02 NOTE — ED Triage Notes (Signed)
 Pt reports lingering cough since end of may. Pt reports was treated for bronchitis with abx, albuterol .also reports left ear is clogged, bilateral ear pressure as well for last several days.

## 2024-03-02 NOTE — Discharge Instructions (Signed)
 Continue the albuterol  inhaler every 4 hours as needed and I have prescribed another round of steroid, a new nasal spray to add to your allergy regimen consistently and some cough syrup.  You may also use Coricidin HBP as needed to help with congestion and sinus pressure as well as saline sinus rinses several times daily as needed.

## 2024-03-05 ENCOUNTER — Encounter: Payer: Self-pay | Admitting: Physician Assistant

## 2024-03-05 ENCOUNTER — Ambulatory Visit: Payer: Self-pay

## 2024-03-05 ENCOUNTER — Ambulatory Visit: Admitting: Physician Assistant

## 2024-03-05 VITALS — BP 139/89 | Temp 98.2°F | Ht 65.0 in | Wt 190.6 lb

## 2024-03-05 DIAGNOSIS — H6122 Impacted cerumen, left ear: Secondary | ICD-10-CM

## 2024-03-05 NOTE — Telephone Encounter (Signed)
 FYI Only or Action Required?: FYI only for provider  Patient was last seen in primary care on 01/06/2023 by Derenda Flax, NP. Called Nurse Triage reporting Cough. Symptoms began several weeks ago. Interventions attempted: Prescription medications: prednisone  and Rest, hydration, or home remedies. Symptoms are: gradually worsening.  Triage Disposition: See Physician Within 24 Hours  Patient/caregiver understands and will follow disposition?: Yes  Copied from CRM [id number]. Topic: Clinical - Red Word Triage >> Mar 05, 2024  9:25 AM Elle L wrote: Red Word that prompted transfer to Nurse Triage: The patient states at the end of May she started to have a horrible cough so she did a [facility] appointment who prescribed prednisone  and antibiotics. However, her cough is worsening. She went to Urgent Care this previous weekend and was given more antibiotics but now her ear is blocked and she is unable to hear. She feels like her head is in a tunnel. Reason for Disposition  Earache  Answer Assessment - Initial Assessment Questions 1. ONSET: When did the cough begin?      End of May 2. SEVERITY: How bad is the cough today?      Persistent. None productive. Still strong.  3. DIFFICULTY BREATHING: Are you having difficulty breathing? If Yes, ask: How bad is it? (e.g., mild, moderate, severe)    - MILD: No SOB at rest, mild SOB with walking, speaks normally in sentences, can lie down, no retractions, pulse < 100.    - MODERATE: SOB at rest, SOB with minimal exertion and prefers to sit, cannot lie down flat, speaks in phrases, mild retractions, audible wheezing, pulse 100-120.    - SEVERE: Very SOB at rest, speaks in single words, struggling to breathe, sitting hunched forward, retractions, pulse > 120      No 4. FEVER: Do you have a fever? If Yes, ask: What is your temperature, how was it measured, and when did it start? no 5. PE RISK FACTORS: Do you have a history of blood clots? (or:  recent major surgery, recent prolonged travel, bedridden)     Flight attendant.  6. OTHER SYMPTOMS: Do you have any other symptoms? (e.g., runny nose, wheezing, chest pain)       Ear congestion, decreased hearing  7. TRAVEL: Have you traveled out of the country in the last month? 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 Symptoms were improving. Urgent care this weekend, rx prednisone , nasal spray. Friday left ear blocked, decreased hearing.  Protocols used: Cough - Acute Productive-A-AH

## 2024-03-05 NOTE — Progress Notes (Signed)
   Acute Office Visit  Subjective:     Patient ID: Amanda Woodard, female    DOB: 06-11-1966, 58 y.o.   MRN: [id number]   Patient presents today for urgent care follow up. She was seen in urgent care on Saturday for cough, congestion, and ear pain. Today she relates continued ear pain and pressure as well lingering cough. She does work as a Financial controller and her ear pain is effecting her ability to do her job. She states no alleviating factors to ear pain and pressure. She has been using a nasal spray daily. She denies headache or fever.      Review of Systems  Constitutional:  Positive for malaise/fatigue. Negative for fever.  HENT:  Positive for congestion and ear pain. Negative for ear discharge and sore throat.   Respiratory:  Positive for cough. Negative for shortness of breath and wheezing.   Musculoskeletal:  Negative for myalgias.  Neurological:  Negative for headaches.        Objective:     BP 139/89   Temp 98.2 F (36.8 C)   Ht 5' 5 (1.651 m)   Wt 190 lb 9.6 oz (86.5 kg)   BMI 31.72 kg/m   Physical Exam Vitals reviewed.  Constitutional:      General: She is not in acute distress.    Appearance: Normal appearance.  HENT:     Right Ear: Tympanic membrane and ear canal normal. No decreased hearing noted. There is no impacted cerumen. Tympanic membrane is not erythematous or bulging.     Left Ear: Tympanic membrane normal. Decreased hearing noted. No tenderness. There is impacted cerumen.     Ears:     Comments: Unable to visualize left TM due to impacted cerumen    Nose: Nose normal.     Mouth/Throat:     Mouth: Mucous membranes are moist.     Pharynx: Oropharynx is clear.   Eyes:     Extraocular Movements: Extraocular movements intact.     Conjunctiva/sclera: Conjunctivae normal.    Cardiovascular:     Rate and Rhythm: Normal rate and regular rhythm.     Heart sounds: Normal heart sounds. No murmur heard. Pulmonary:     Effort: Pulmonary effort is  normal.     Breath sounds: Normal breath sounds. No wheezing, rhonchi or rales.  Lymphadenopathy:     Cervical: No cervical adenopathy.   Skin:    General: Skin is warm and dry.   Neurological:     General: No focal deficit present.     Mental Status: She is alert and oriented to person, place, and time.     No results found for any visits on 03/05/24.      Assessment & Plan:  Impacted cerumen of left ear Assessment & Plan: Patient presents today with ear pressure, pain, and decreased hearing of the left ear. Impacted cerumen appreciated on otoscopic exam. Ear irrigated today in office. Significant cerumen removed from canal. Patient with improved symptoms in office. Advised continue with nasal spray for symptoms. Warning signs and follow up precautions discussed.      Return if symptoms worsen or fail to improve.  Jearlean Mince Samaiya Awadallah, PA-C

## 2024-03-05 NOTE — Assessment & Plan Note (Signed)
 Patient presents today with ear pressure, pain, and decreased hearing of the left ear. Impacted cerumen appreciated on otoscopic exam. Ear irrigated today in office. Significant cerumen removed from canal. Patient with improved symptoms in office. Advised continue with nasal spray for symptoms. Warning signs and follow up precautions discussed.

## 2024-03-06 NOTE — ED Provider Notes (Signed)
 [facility]    CSN: [id number] Arrival date & time: 03/02/24  0802      History   Chief Complaint No chief complaint on file.   HPI Amanda Woodard is a 58 y.o. female.   Patient presenting today with about a month of ongoing cough.  Was diagnosed with bronchitis and treated with antibiotics, albuterol , prednisone  with mild temporary relief but states symptoms returned and have worsened.  Denies chest pain, shortness of breath, abdominal pain, vomiting, diarrhea.  Currently not try anything over-the-counter for symptoms.  No known history of chronic pulmonary disease.    Past Medical History:  Diagnosis Date   Anxiety    Vitamin D  deficiency     Patient Active Problem List   Diagnosis Date Noted   Impacted cerumen of left ear 03/05/2024   Depression with anxiety 12/03/2022   Obesity (BMI 30-39.9) 12/02/2022   Sleep disturbance 12/02/2022   Perirectal abscess 04/26/2021   COVID-19 virus infection 02/04/2021   Acute bacterial rhinosinusitis 02/04/2021   Cough 02/04/2021   Chest tightness 02/04/2021   History of ovarian cyst 02/28/2020   Sciatica, left side 02/28/2020   History of COVID-19 02/28/2020   Overweight (BMI 25.0-29.9) 02/28/2020   Anxiety 02/28/2020    Past Surgical History:  Procedure Laterality Date   BREAST BIOPSY  12/25/2023   MM RT RADIOACTIVE SEED LOC MAMMO GUIDE 12/25/2023 [facility] M[facility]MOGRAP[facility]   CYST EXCISION  2005   HEAD   INCISION AND DRAINAGE PERIRECTAL ABSCESS N/A 04/26/2021   Procedure: IRRIGATION AND DEBRIDEMENT PERIRECTAL ABSCESS;  Surgeon: Shela Derby, MD;  Location: [facility] OR;  Service: General;  Laterality: N/A;   RADIOACTIVE SEED GUIDED EXCISIONAL BREAST BIOPSY Right 12/26/2023   Procedure: RIGHT BREAST SEED GUIDED EXCISIONAL BIOPSY;  Surgeon: Enid Harry, MD;  Location: [facility];  Service: General;  Laterality: Right;  LMA, right    OB History     Gravida  2   Para  2   Term      Preterm      AB       Living  2      SAB      IAB      Ectopic      Multiple      Live Births               Home Medications    Prior to Admission medications   Medication Sig Start Date End Date Taking? Authorizing Provider  albuterol  (VENTOLIN  HFA) 108 (90 Base) MCG/ACT inhaler SMARTSIG:2 Puff(s) Via Inhaler 4 Times Daily PRN 02/14/24  Yes [provider]  amoxicillin -clavulanate (AUGMENTIN ) 875-125 MG tablet SMARTSIG:1 Tablet(s) By Mouth Every 12 Hours 02/17/24  Yes [provider]  azelastine (ASTELIN) 0.1 % nasal spray Place 1 spray into both nostrils 2 (two) times daily. Use in each nostril as directed 03/02/24  Yes Corbin Dess, PA-C  predniSONE  (DELTASONE ) 20 MG tablet Take 2 tablets (40 mg total) by mouth daily with breakfast. 03/02/24  Yes Corbin Dess, PA-C  promethazine-dextromethorphan (PROMETHAZINE-DM) 6.25-15 MG/5ML syrup Take 5 mLs by mouth 4 (four) times daily as needed. 03/02/24  Yes Corbin Dess, PA-C  acetaminophen  (TYLENOL ) 500 MG tablet Take 1,000 mg by mouth every 8 (eight) hours as needed (pain).    [provider]  ALPRAZolam  (XANAX ) 0.5 MG tablet TAKE 1 TABLET BY MOUTH AT BEDTIME AS NEEDED FOR SLEEP 03/17/23   Luking, Jackelyn Marvel, MD  Bioflavonoid Products (VITAMIN C) CHEW  Chew 1 tablet by mouth daily.    [provider]  Cholecalciferol (VITAMIN D3 PO) Take 1 tablet by mouth daily as needed (immune system boost).    [provider]  diclofenac  (CATAFLAM) 50 MG tablet Take 75 mg by mouth.    [provider]  diclofenac  (VOLTAREN ) 75 MG EC tablet Take 1 tablet (75 mg total) by mouth 2 (two) times daily as needed (pain). 04/28/21   Monetta Angst, PA-C  diclofenac  Sodium (VOLTAREN ) 1 % GEL Apply 1 application topically 4 (four) times daily as needed (pain).    [provider]  FEXOFENADINE HCL PO Take 1 tablet by mouth daily as needed (congestion/allergies).    [provider]   levonorgestrel (MIRENA) 20 MCG/24HR IUD 1 each by Intrauterine route once. Implanted summer 2021    [provider]  Multiple Vitamins-Minerals (ZINC PO) Take 1 tablet by mouth daily as needed (immune system boost).    [provider]  OVER THE COUNTER MEDICATION B12 3000 mcg 1 bid    [provider]  phentermine  (ADIPEX-P ) 37.5 MG tablet Take 1 tablet (37.5 mg total) by mouth daily before breakfast. 01/06/23   Derenda Flax, NP    Family History Family History  Problem Relation Age of Onset   Heart disease Father 56   Hyperlipidemia Father    Hypertension Father    COPD Maternal Grandfather    Diabetes Mother    Heart attack Paternal Grandfather    Stroke Maternal Grandmother    Dementia Maternal Grandmother    Obesity Brother    Colon cancer Neg Hx    Esophageal cancer Neg Hx    Rectal cancer Neg Hx    Stomach cancer Neg Hx     Social History Social History   Tobacco Use   Smoking status: Never   Smokeless tobacco: Never  Vaping Use   Vaping status: Never Used  Substance Use Topics   Alcohol use: Yes   Drug use: No     Allergies   Patient has no known allergies.   Review of Systems Review of Systems Per HPI  Physical Exam Triage Vital Signs ED Triage Vitals  Encounter Vitals Group     BP 03/02/24 0832 (!) 143/97     Girls Systolic BP Percentile --      Girls Diastolic BP Percentile --      Boys Systolic BP Percentile --      Boys Diastolic BP Percentile --      Pulse Rate 03/02/24 0832 95     Resp 03/02/24 0832 20     Temp 03/02/24 0832 98.4 F (36.9 C)     Temp Source 03/02/24 0832 Oral     SpO2 03/02/24 0832 94 %     Weight --      Height --      Head Circumference --      Peak Flow --      Pain Score 03/02/24 0829 0     Pain Loc --      Pain Education --      Exclude from Growth Chart --    No data found.  Updated Vital Signs BP (!) 143/97 (BP Location: Right Arm)   Pulse 95   Temp 98.4 F (36.9 C)  (Oral)   Resp 20   SpO2 94%   Visual Acuity Right Eye Distance:   Left Eye Distance:   Bilateral Distance:    Right Eye Near:   Left Eye  Near:    Bilateral Near:     Physical Exam Vitals and nursing note reviewed.  Constitutional:      Appearance: Normal appearance. She is not ill-appearing.  HENT:     Head: Atraumatic.     Right Ear: Tympanic membrane normal.     Left Ear: Tympanic membrane normal.     Nose: Rhinorrhea present.     Mouth/Throat:     Pharynx: Posterior oropharyngeal erythema present.   Eyes:     Extraocular Movements: Extraocular movements intact.     Conjunctiva/sclera: Conjunctivae normal.    Cardiovascular:     Rate and Rhythm: Normal rate and regular rhythm.     Heart sounds: Normal heart sounds.  Pulmonary:     Effort: Pulmonary effort is normal.     Breath sounds: Wheezing present. No rales.   Musculoskeletal:        General: Normal range of motion.     Cervical back: Normal range of motion and neck supple.   Skin:    General: Skin is warm and dry.   Neurological:     Mental Status: She is alert and oriented to person, place, and time.   Psychiatric:        Mood and Affect: Mood normal.        Thought Content: Thought content normal.        Judgment: Judgment normal.      UC Treatments / Results  Labs (all labs ordered are listed, but only abnormal results are displayed) Labs Reviewed - No data to display  EKG   Radiology No results found.  Procedures Procedures (including critical care time)  Medications Ordered in UC Medications - No data to display  Initial Impression / Assessment and Plan / UC Course  I have reviewed the triage vital signs and the nursing notes.  Pertinent labs & imaging results that were available during my care of the patient were reviewed by me and considered in my medical decision making (see chart for details).     Suspect viral bronchitis and allergic sinusitis.  Treat with prednisone ,  Astelin, Phenergan DM, supportive over-the-counter medications and home care as well as continuing allergy regimen.  Return for worsening symptoms.  Final Clinical Impressions(s) / UC Diagnoses   Final diagnoses:  Viral bronchitis  Allergic sinusitis     Discharge Instructions      Continue the albuterol  inhaler every 4 hours as needed and I have prescribed another round of steroid, a new nasal spray to add to your allergy regimen consistently and some cough syrup.  You may also use Coricidin HBP as needed to help with congestion and sinus pressure as well as saline sinus rinses several times daily as needed.      ED Prescriptions     Medication Sig Dispense Auth. Provider   predniSONE  (DELTASONE ) 20 MG tablet Take 2 tablets (40 mg total) by mouth daily with breakfast. 10 tablet Corbin Dess, PA-C   azelastine (ASTELIN) 0.1 % nasal spray Place 1 spray into both nostrils 2 (two) times daily. Use in each nostril as directed 30 mL Corbin Dess, PA-C   promethazine-dextromethorphan (PROMETHAZINE-DM) 6.25-15 MG/5ML syrup Take 5 mLs by mouth 4 (four) times daily as needed. 100 mL Corbin Dess, [place]      PDMP not reviewed this encounter.   Corbin Dess, [place] 03/06/24 1939

## 2024-04-08 ENCOUNTER — Encounter: Payer: Self-pay | Admitting: Nurse Practitioner

## 2024-04-08 ENCOUNTER — Ambulatory Visit (INDEPENDENT_AMBULATORY_CARE_PROVIDER_SITE_OTHER): Admitting: Nurse Practitioner

## 2024-04-08 VITALS — BP 144/92 | HR 105 | Temp 97.6°F | Ht 65.0 in | Wt 189.0 lb

## 2024-04-08 DIAGNOSIS — F418 Other specified anxiety disorders: Secondary | ICD-10-CM

## 2024-04-08 DIAGNOSIS — I10 Essential (primary) hypertension: Secondary | ICD-10-CM | POA: Diagnosis not present

## 2024-04-08 DIAGNOSIS — R5383 Other fatigue: Secondary | ICD-10-CM

## 2024-04-08 DIAGNOSIS — E559 Vitamin D deficiency, unspecified: Secondary | ICD-10-CM

## 2024-04-08 MED ORDER — VALSARTAN 80 MG PO TABS: 80.0000 mg | ORAL_TABLET | Freq: Every day | ORAL | 0 refills | Status: DC

## 2024-04-08 NOTE — Patient Instructions (Addendum)
 Qsymia   DASH Eating Plan DASH stands for Dietary Approaches to Stop Hypertension. The DASH eating plan is a healthy eating plan that has been shown to: Lower high blood pressure (hypertension). Reduce your risk for type 2 diabetes, heart disease, and stroke. Help with weight loss. What are tips for following this plan? Reading food labels Check food labels for the amount of salt (sodium) per serving. Choose foods with less than 5 percent of the Daily Value (DV) of sodium. In general, foods with less than 300 milligrams (mg) of sodium per serving fit into this eating plan. To find whole grains, look for the word whole as the first word in the ingredient list. Shopping Buy products labeled as low-sodium or no salt added. Buy fresh foods. Avoid canned foods and pre-made or frozen meals. Cooking Try not to add salt when you cook. Use salt-free seasonings or herbs instead of table salt or sea salt. Check with your health care provider or pharmacist before using salt substitutes. Do not fry foods. Cook foods in healthy ways, such as baking, boiling, grilling, roasting, or broiling. Cook using oils that are good for your heart. These include olive, canola, avocado, soybean, and sunflower oil. Meal planning  Eat a balanced diet. This should include: 4 or more servings of fruits and 4 or more servings of vegetables each day. Try to fill half of your plate with fruits and vegetables. 6-8 servings of whole grains each day. 6 or less servings of lean meat, poultry, or fish each day. 1 oz is 1 serving. A 3 oz (85 g) serving of meat is about the same size as the palm of your hand. One egg is 1 oz (28 g). 2-3 servings of low-fat dairy each day. One serving is 1 cup (237 mL). 1 serving of nuts, seeds, or beans 5 times each week. 2-3 servings of heart-healthy fats. Healthy fats called omega-3 fatty acids are found in foods such as walnuts, flaxseeds, fortified milks, and eggs. These fats are also  found in cold-water fish, such as sardines, salmon, and mackerel. Limit how much you eat of: Canned or prepackaged foods. Food that is high in trans fat, such as fried foods. Food that is high in saturated fat, such as fatty meat. Desserts and other sweets, sugary drinks, and other foods with added sugar. Full-fat dairy products. Do not salt foods before eating. Do not eat more than 4 egg yolks a week. Try to eat at least 2 vegetarian meals a week. Eat more home-cooked food and less restaurant, buffet, and fast food. Lifestyle When eating at a restaurant, ask if your food can be made with less salt or no salt. If you drink alcohol: Limit how much you have to: 0-1 drink a day if you are female. 0-2 drinks a day if you are female. Know how much alcohol is in your drink. In the [place] one drink is one 12 oz bottle of beer (355 mL), one 5 oz glass of wine (148 mL), or one 1 oz glass of hard liquor (44 mL). General information Avoid eating more than 2,300 mg of salt a day. If you have hypertension, you may need to reduce your sodium intake to 1,500 mg a day. Work with your provider to stay at a healthy body weight or lose weight. Ask what the best weight range is for you. On most days of the week, get at least 30 minutes of exercise that causes your heart to beat faster. This may include  walking, swimming, or biking. Work with your provider or dietitian to adjust your eating plan to meet your specific calorie needs. What foods should I eat? Fruits All fresh, dried, or frozen fruit. Canned fruits that are in their natural juice and do not have sugar added to them. Vegetables Fresh or frozen vegetables that are raw, steamed, roasted, or grilled. Low-sodium or reduced-sodium tomato and vegetable juice. Low-sodium or reduced-sodium tomato sauce and tomato paste. Low-sodium or reduced-sodium canned vegetables. Grains Whole-grain or whole-wheat bread. Whole-grain or whole-wheat pasta. Brown rice.  [place]ira. Bulgur. Whole-grain and low-sodium cereals. Pita bread. Low-fat, low-sodium crackers. Whole-wheat flour tortillas. Meats and other proteins Skinless chicken or [place]. Ground chicken or [place]. Pork with fat trimmed off. Fish and seafood. Egg whites. Dried beans, peas, or lentils. Unsalted nuts, nut butters, and seeds. Unsalted canned beans. Lean cuts of beef with fat trimmed off. Low-sodium, lean precooked or cured meat, such as sausages or meat loaves. Dairy Low-fat (1%) or fat-free (skim) milk. Reduced-fat, low-fat, or fat-free cheeses. Nonfat, low-sodium ricotta or cottage cheese. Low-fat or nonfat yogurt. Low-fat, low-sodium cheese. Fats and oils Soft margarine without trans fats. Vegetable oil. Reduced-fat, low-fat, or light mayonnaise and salad dressings (reduced-sodium). Canola, safflower, olive, avocado, soybean, and sunflower oils. Avocado. Seasonings and condiments Herbs. Spices. Seasoning mixes without salt. Other foods Unsalted popcorn and pretzels. Fat-free sweets. The items listed above may not be all the foods and drinks you can have. Talk to a dietitian to learn more. What foods should I avoid? Fruits Canned fruit in a light or heavy syrup. Fried fruit. Fruit in cream or butter sauce. Vegetables Creamed or fried vegetables. Vegetables in a cheese sauce. Regular canned vegetables that are not marked as low-sodium or reduced-sodium. Regular canned tomato sauce and paste that are not marked as low-sodium or reduced-sodium. Regular tomato and vegetable juices that are not marked as low-sodium or reduced-sodium. Dene. Olives. Grains Baked goods made with fat, such as croissants, muffins, or some breads. Dry pasta or rice meal packs. Meats and other proteins Fatty cuts of meat. Ribs. Fried meat. [place]. Bologna, salami, and other precooked or cured meats, such as sausages or meat loaves, that are not lean and low in sodium. Fat from the back of a pig (fatback).  Bratwurst. Salted nuts and seeds. Canned beans with added salt. Canned or smoked fish. Whole eggs or egg yolks. Chicken or [place] with skin. Dairy Whole or 2% milk, cream, and half-and-half. Whole or full-fat cream cheese. Whole-fat or sweetened yogurt. Full-fat cheese. Nondairy creamers. Whipped toppings. Processed cheese and cheese spreads. Fats and oils Butter. Stick margarine. Lard. Shortening. Ghee. Bacon fat. Tropical oils, such as coconut, palm kernel, or palm oil. Seasonings and condiments Onion salt, garlic salt, seasoned salt, table salt, and sea salt. Worcestershire sauce. Tartar sauce. Barbecue sauce. Teriyaki sauce. Soy sauce, including reduced-sodium soy sauce. Steak sauce. Canned and packaged gravies. Fish sauce. Oyster sauce. Cocktail sauce. Store-bought horseradish. Ketchup. Mustard. Meat flavorings and tenderizers. Bouillon cubes. Hot sauces. Pre-made or packaged marinades. Pre-made or packaged taco seasonings. Relishes. Regular salad dressings. Other foods Salted popcorn and pretzels. The items listed above may not be all the foods and drinks you should avoid. Talk to a dietitian to learn more. Where to find more information National Heart, Lung, and Blood Institute (NHLBI): BuffaloDryCleaner.gl American Heart Association (AHA): heart.org Academy of Nutrition and Dietetics: eatright.org National Kidney Foundation (NKF): kidney.org This information is not intended to replace advice given to you by your health care  provider. Make sure you discuss any questions you have with your health care provider. Document Revised: 09/22/2022 Document Reviewed: 09/22/2022 Elsevier Patient Education  2024 [employer].

## 2024-04-09 ENCOUNTER — Encounter: Payer: Self-pay | Admitting: Nurse Practitioner

## 2024-04-09 NOTE — Progress Notes (Signed)
 Subjective:    Patient ID: Amanda Woodard, female    DOB: Jul 03, 1966, 58 y.o.   MRN: [id number]  HPI Presents to discuss her elevated blood pressure.  Has been elevated in our office as well as outside offices.  Has also had her Apple Watch alarm due to increased blood pressure.  Has a strong family history of hypertension. In addition patient has been struggling some with anxiety and depression.  Takes a very rare Xanax  0.5 mg as needed for sleep.  Works as a Financial controller.  Is getting ready to take a long trip.  Is also due for lab work.  Gets regular preventive health physicals with gynecology.  Diet overall tries to eat healthy.  Has a very active job where she is on her feet and walking.   Review of Systems  Constitutional:  Positive for fatigue.  HENT:  Negative for sore throat and trouble swallowing.   Respiratory:  Negative for cough, chest tightness and shortness of breath.   Cardiovascular:  Negative for chest pain and leg swelling.  Neurological:  Negative for facial asymmetry, speech difficulty, weakness and numbness.      04/08/2024    3:02 PM  Depression screen PHQ 2/9  Decreased Interest 1  Down, Depressed, Hopeless 1  PHQ - 2 Score 2  Altered sleeping 2  Tired, decreased energy 2  Change in appetite 2  Feeling bad or failure about yourself  1  Trouble concentrating 1  Moving slowly or fidgety/restless 0  Suicidal thoughts 0  PHQ-9 Score 10  Difficult doing work/chores Not difficult at all      04/08/2024    3:02 PM 12/02/2022    9:34 AM  GAD 7 : Generalized Anxiety Score  Nervous, Anxious, on Edge 0 0  Control/stop worrying 1 0  Worry too much - different things 0 1  Trouble relaxing 2 2  Restless 1 2  Easily annoyed or irritable 0 1  Afraid - awful might happen 0 0  Total GAD 7 Score 4 6  Anxiety Difficulty Not difficult at all Somewhat difficult    Social History   Tobacco Use   Smoking status: Never   Smokeless tobacco: Never  Vaping Use    Vaping status: Never Used  Substance Use Topics   Alcohol use: Yes   Drug use: No        Objective:   Physical Exam Vitals reviewed.  Constitutional:      General: She is not in acute distress. Neck:     Comments: Thyroid nontender to palpation, no mass or goiter noted. Cardiovascular:     Rate and Rhythm: Normal rate and regular rhythm.     Heart sounds: Normal heart sounds.  Pulmonary:     Effort: Pulmonary effort is normal.     Breath sounds: Normal breath sounds.  Neurological:     Mental Status: She is alert.  Psychiatric:        Mood and Affect: Mood normal.        Behavior: Behavior normal.        Thought Content: Thought content normal.        Judgment: Judgment normal.    Today's Vitals   04/08/24 1427 04/08/24 1651  BP: (!) 162/91 (!) 144/92  Pulse: (!) 105   Temp: 97.6 F (36.4 C)   SpO2: 98%   Weight: 189 lb (85.7 kg)   Height: 5' 5 (1.651 m)    Body mass index is 31.45 kg/m.  Assessment & Plan:   Problem List Items Addressed This Visit       Cardiovascular and Mediastinum   Essential hypertension - Primary   Relevant Medications   valsartan  (DIOVAN ) 80 MG tablet   Other Relevant Orders   Comprehensive metabolic panel with GFR   Lipid panel   Microalbumin / creatinine urine ratio     Other   Depression with anxiety   Vitamin D  deficiency   Relevant Orders   VITAMIN D  25 Hydroxy (Vit-D Deficiency, Fractures)   Other Visit Diagnoses       Fatigue, unspecified type       Relevant Orders   CBC with Differential/Platelet   Comprehensive metabolic panel with GFR   TSH      Routine labs ordered. Discussed importance of healthy diet, regular activity and a weight loss goal of 10 pounds.  Note the patient saw minimal improvement on phentermine , consider Qsymia. Meds ordered this encounter  Medications   valsartan  (DIOVAN ) 80 MG tablet    Sig: Take 1 tablet (80 mg total) by mouth daily. For BP    Dispense:  30 tablet     Refill:  0    Supervising Provider:   ALPHONSA HAMILTON A [[id number]]   Start valsartan  80 mg daily.  Monitor BP outside the office.  Once her BP is stable, consider medication for anxiety and depression.  Given information on low-sodium-diet. Return in about 3 months (around 07/09/2024).

## 2024-04-10 LAB — LIPID PANEL
Chol/HDL Ratio: 5.2 ratio — ABNORMAL HIGH (ref 0.0–4.4)
Cholesterol, Total: 230 mg/dL — ABNORMAL HIGH (ref 100–199)
HDL: 44 mg/dL (ref >39)
LDL Chol Calc (NIH): 150 mg/dL — ABNORMAL HIGH (ref 0–99)
Triglycerides: 197 mg/dL — ABNORMAL HIGH (ref 0–149)
VLDL Cholesterol Cal: 36 mg/dL (ref 5–40)

## 2024-04-10 LAB — CBC WITH DIFFERENTIAL/PLATELET
Basophils Absolute: 0 x10E3/uL (ref 0.0–0.2)
Basos: 1 %
EOS (ABSOLUTE): 0.4 x10E3/uL (ref 0.0–0.4)
Eos: 7 %
Hematocrit: 49.7 % — ABNORMAL HIGH (ref 34.0–46.6)
Hemoglobin: 15.9 g/dL (ref 11.1–15.9)
Immature Grans (Abs): 0 x10E3/uL (ref 0.0–0.1)
Immature Granulocytes: 0 %
Lymphocytes Absolute: 1.5 x10E3/uL (ref 0.7–3.1)
Lymphs: 25 %
MCH: 31.4 pg (ref 26.6–33.0)
MCHC: 32 g/dL (ref 31.5–35.7)
MCV: 98 fL — ABNORMAL HIGH (ref 79–97)
Monocytes Absolute: 0.6 x10E3/uL (ref 0.1–0.9)
Monocytes: 9 %
Neutrophils Absolute: 3.4 x10E3/uL (ref 1.4–7.0)
Neutrophils: 58 %
Platelets: 222 x10E3/uL (ref 150–450)
RBC: 5.07 x10E6/uL (ref 3.77–5.28)
RDW: 12.7 % (ref 11.7–15.4)
WBC: 6 x10E3/uL (ref 3.4–10.8)

## 2024-04-10 LAB — COMPREHENSIVE METABOLIC PANEL WITH GFR
ALT: 34 IU/L — ABNORMAL HIGH (ref 0–32)
AST: 25 IU/L (ref 0–40)
Albumin: 4.4 g/dL (ref 3.8–4.9)
Alkaline Phosphatase: 62 IU/L (ref 44–121)
BUN/Creatinine Ratio: 22 (ref 9–23)
BUN: 18 mg/dL (ref 6–24)
Bilirubin Total: 0.7 mg/dL (ref 0.0–1.2)
CO2: 21 mmol/L (ref 20–29)
Calcium: 9.4 mg/dL (ref 8.7–10.2)
Chloride: 103 mmol/L (ref 96–106)
Creatinine, Ser: 0.81 mg/dL (ref 0.57–1.00)
Globulin, Total: 2.5 g/dL (ref 1.5–4.5)
Glucose: 93 mg/dL (ref 70–99)
Potassium: 4.6 mmol/L (ref 3.5–5.2)
Sodium: 141 mmol/L (ref 134–144)
Total Protein: 6.9 g/dL (ref 6.0–8.5)
eGFR: 86 mL/min/1.73 (ref >59)

## 2024-04-10 LAB — TSH: TSH: 2.79 u[IU]/mL (ref 0.450–4.500)

## 2024-04-10 LAB — VITAMIN D 25 HYDROXY (VIT D DEFICIENCY, FRACTURES): Vit D, 25-Hydroxy: 30.9 ng/mL (ref 30.0–100.0)

## 2024-04-10 LAB — MICROALBUMIN / CREATININE URINE RATIO
Creatinine, Urine: 67.3 mg/dL
Microalb/Creat Ratio: 5 mg/g{creat} (ref 0–29)
Microalbumin, Urine: 3.3 ug/mL

## 2024-04-11 ENCOUNTER — Ambulatory Visit: Payer: Self-pay | Admitting: Nurse Practitioner

## 2024-04-26 ENCOUNTER — Ambulatory Visit: Admitting: Plastic Surgery

## 2024-04-26 ENCOUNTER — Encounter: Payer: Self-pay | Admitting: Plastic Surgery

## 2024-04-26 VITALS — BP 140/84 | HR 97 | Ht 65.0 in | Wt 190.0 lb

## 2024-04-26 DIAGNOSIS — M542 Cervicalgia: Secondary | ICD-10-CM | POA: Diagnosis not present

## 2024-04-26 DIAGNOSIS — N62 Hypertrophy of breast: Secondary | ICD-10-CM

## 2024-04-26 DIAGNOSIS — M546 Pain in thoracic spine: Secondary | ICD-10-CM

## 2024-04-26 DIAGNOSIS — Z6831 Body mass index (BMI) 31.0-31.9, adult: Secondary | ICD-10-CM | POA: Diagnosis not present

## 2024-04-26 DIAGNOSIS — M549 Dorsalgia, unspecified: Secondary | ICD-10-CM | POA: Insufficient documentation

## 2024-04-26 DIAGNOSIS — G8929 Other chronic pain: Secondary | ICD-10-CM

## 2024-04-26 NOTE — Progress Notes (Signed)
 Patient ID: Amanda Woodard, female    DOB: 1966/04/21, 58 y.o.   MRN: [id number]   Chief Complaint  Patient presents with   Breast Problem    Mammary Hyperplasia: The patient is a 58 y.o. female with a history of mammary hyperplasia for several years.  She has extremely large breasts causing symptoms that include the following: Back pain in the upper and lower back, including neck pain. She pulls or pins her bra straps to provide better lift and relief of the pressure and pain. She notices relief by holding her breast up manually.  Her shoulder straps cause grooves and pain and pressure that requires padding for relief. Pain medication is sometimes required with motrin and tylenol .  Activities that are hindered by enlarged breasts include: exercise and running.  She has tried supportive clothing as well as fitted bras without improvement.  Her breasts are extremely large and fairly symmetric.  She has hyperpigmentation of the inframammary area on both sides.  The sternal to nipple distance on the right is 33 cm and the left is 34 cm.  The IMF distance is 15 cm.  She is 5 feet 5 inches tall and weighs 190 pounds.  The BMI = 31.6 kg/m.  Preoperative bra size = DDDD cup.  The estimated excess breast tissue to be removed at the time of surgery = 550-600 grams on the left and 550-600 grams on the right.  Mammogram history: dec 2024 and underwent a biopsy = negative.  Tobacco use:  none.   The patient expresses the desire to pursue surgical intervention. Severe back pain due to job and injury in flight.     Review of Systems  Constitutional: Negative.   HENT: Negative.    Eyes: Negative.   Respiratory: Negative.    Cardiovascular: Negative.   Gastrointestinal: Negative.   Endocrine: Negative.   Genitourinary: Negative.   Musculoskeletal:  Positive for back pain and neck pain.  Skin:  Positive for rash.    Past Medical History:  Diagnosis Date   Anxiety    Vitamin D  deficiency     Past  Surgical History:  Procedure Laterality Date   BREAST BIOPSY  12/25/2023   MM RT RADIOACTIVE SEED LOC MAMMO GUIDE 12/25/2023 [facility] MAMMO[facility]AP[facility]   CYST EXCISION  2005   HEAD   INCISION AND DRAINAGE PERIRECTAL ABSCESS N/A 04/26/2021   Procedure: IRRIGATION AND DEBRIDEMENT PERIRECTAL ABSCESS;  Surgeon: Rubin Calamity, MD;  Location: [facility] OR;  Service: General;  Laterality: N/A;   RADIOACTIVE SEED GUIDED EXCISIONAL BREAST BIOPSY Right 12/26/2023   Procedure: RIGHT BREAST SEED GUIDED EXCISIONAL BIOPSY;  Surgeon: Ebbie Cough, MD;  Location: [facility];  Service: General;  Laterality: Right;  LMA, right      Current Outpatient Medications:    acetaminophen  (TYLENOL ) 500 MG tablet, Take 1,000 mg by mouth every 8 (eight) hours as needed (pain)., Disp: , Rfl:    albuterol  (VENTOLIN  HFA) 108 (90 Base) MCG/ACT inhaler, SMARTSIG:2 Puff(s) Via Inhaler 4 Times Daily PRN, Disp: , Rfl:    ALPRAZolam  (XANAX ) 0.5 MG tablet, TAKE 1 TABLET BY MOUTH AT BEDTIME AS NEEDED FOR SLEEP, Disp: 30 tablet, Rfl: 3   Bioflavonoid Products (VITAMIN C) CHEW, Chew 1 tablet by mouth daily., Disp: , Rfl:    Cholecalciferol (VITAMIN D3 PO), Take 1 tablet by mouth daily as needed (immune system boost)., Disp: , Rfl:    diclofenac  (VOLTAREN ) 75 MG EC tablet, Take 1 tablet (75 mg total) by mouth  2 (two) times daily as needed (pain)., Disp: , Rfl:    diclofenac  Sodium (VOLTAREN ) 1 % GEL, Apply 1 application topically 4 (four) times daily as needed (pain)., Disp: , Rfl:    FEXOFENADINE HCL PO, Take 1 tablet by mouth daily as needed (congestion/allergies)., Disp: , Rfl:    levonorgestrel (MIRENA) 20 MCG/24HR IUD, 1 each by Intrauterine route once. Implanted summer 2021, Disp: , Rfl:    Multiple Vitamins-Minerals (ZINC PO), Take 1 tablet by mouth daily as needed (immune system boost)., Disp: , Rfl:    OVER THE COUNTER MEDICATION, B12 3000 mcg 1 bid, Disp: , Rfl:    valsartan  (DIOVAN ) 80 MG tablet, Take 1 tablet (80 mg  total) by mouth daily. For BP, Disp: 30 tablet, Rfl: 0   Objective:   There were no vitals filed for this visit.  Physical Exam Vitals reviewed.  Constitutional:      Appearance: Normal appearance.  HENT:     Head: Atraumatic.  Cardiovascular:     Rate and Rhythm: Normal rate.     Pulses: Normal pulses.  Pulmonary:     Effort: Pulmonary effort is normal.  Abdominal:     Palpations: Abdomen is soft.  Skin:    General: Skin is warm.     Capillary Refill: Capillary refill takes less than 2 seconds.  Neurological:     Mental Status: She is alert and oriented to person, place, and time.  Psychiatric:        Mood and Affect: Mood normal.        Behavior: Behavior normal.        Thought Content: Thought content normal.        Judgment: Judgment normal.     Assessment & Plan:  Symptomatic mammary hypertrophy  Chronic bilateral thoracic back pain  Neck pain  The procedure the patient selected and that was best for the patient was discussed. The risk were discussed and include but not limited to the following:  Breast asymmetry, fluid accumulation, firmness of the breast, inability to breast feed, loss of nipple or areola, skin loss, change in skin and nipple sensation, fat necrosis of the breast tissue, bleeding, infection and healing delay.  There are risks of anesthesia and injury to nerves or blood vessels.  Allergic reaction to tape, suture and skin glue are possible.  There will be swelling.  Any of these can lead to the need for revisional surgery which is not included in this surgery.  A breast reduction has potential to interfere with diagnostic procedures in the future.  This procedure is best done when the breast is fully developed.  Changes in the breast will continue to occur over time: pregnancy, weight gain or weigh loss. No guarantees are given for a certain bra or breast size.    Total time: 40 minutes. This includes time spent with the patient during the visit as well  as time spent before and after the visit reviewing the chart, documenting the encounter, ordering pertinent studies and literature for the patient.   Physical therapy:  2019 Mammogram:  Dec 2024  The patient is a good candidate for bilateral breast reduction with lateral liposuction.  Pictures were obtained of the patient and placed in the chart with the patient's or guardian's permission.   Estefana RAMAN Rae Plotner, DO

## 2024-05-08 ENCOUNTER — Other Ambulatory Visit: Payer: Self-pay | Admitting: Family Medicine

## 2024-05-08 NOTE — Telephone Encounter (Signed)
 Copied from CRM #[id number]. Topic: Clinical - Medication Refill >> May 08, 2024  2:24 PM Everette C wrote: Medication: valsartan  (DIOVAN ) 80 MG tablet [[id number]]  Has the patient contacted their pharmacy? Yes (Agent: If no, request that the patient contact the pharmacy for the refill. If patient does not wish to contact the pharmacy document the reason why and proceed with request.) (Agent: If yes, when and what did the pharmacy advise?)  This is the patient's preferred pharmacy:  [facility] [place], [place] - [id number] [place] [address] Phone: [phone] Fax: [phone]  Is this the correct pharmacy for this prescription? Yes If no, delete pharmacy and type the correct one.   Has the prescription been filled recently? Yes  Is the patient out of the medication? Yes  Has the patient been seen for an appointment in the last year OR does the patient have an upcoming appointment? Yes  Can we respond through MyChart? No  Agent: Please be advised that Rx refills may take up to 3 business days. We ask that you follow-up with your pharmacy.

## 2024-05-10 ENCOUNTER — Telehealth: Payer: Self-pay | Admitting: Student

## 2024-05-10 NOTE — Telephone Encounter (Signed)
 lvmail to r/s to [facility] is in sx

## 2024-05-13 ENCOUNTER — Telehealth: Payer: Self-pay | Admitting: Family Medicine

## 2024-05-13 ENCOUNTER — Encounter: Payer: Self-pay | Admitting: Nurse Practitioner

## 2024-05-13 NOTE — Telephone Encounter (Signed)
 Refill on valsartan  80 mg Send to [facility] 

## 2024-05-14 ENCOUNTER — Other Ambulatory Visit: Payer: Self-pay | Admitting: Family Medicine

## 2024-05-14 MED ORDER — VALSARTAN 80 MG PO TABS: 80.0000 mg | ORAL_TABLET | Freq: Every day | ORAL | 2 refills | Status: AC

## 2024-05-21 ENCOUNTER — Encounter: Payer: Self-pay | Admitting: Family Medicine

## 2024-05-21 NOTE — Telephone Encounter (Signed)
 I did send in refills please see the MyChart message is also recommended to follow-up by late spring

## 2024-05-28 ENCOUNTER — Encounter: Payer: Self-pay | Admitting: Nurse Practitioner

## 2024-05-31 ENCOUNTER — Encounter: Admitting: Student

## 2024-06-10 ENCOUNTER — Encounter: Payer: Self-pay | Admitting: Student

## 2024-06-10 ENCOUNTER — Ambulatory Visit (INDEPENDENT_AMBULATORY_CARE_PROVIDER_SITE_OTHER): Admitting: Student

## 2024-06-10 VITALS — BP 142/94 | HR 100 | Ht 65.0 in | Wt 190.0 lb

## 2024-06-10 DIAGNOSIS — N62 Hypertrophy of breast: Secondary | ICD-10-CM

## 2024-06-10 MED ORDER — CEPHALEXIN 500 MG PO CAPS: 500.0000 mg | ORAL_CAPSULE | Freq: Four times a day (QID) | ORAL | 0 refills | Status: AC

## 2024-06-10 MED ORDER — ONDANSETRON HCL 4 MG PO TABS: 4.0000 mg | ORAL_TABLET | Freq: Three times a day (TID) | ORAL | 0 refills | Status: AC | PRN

## 2024-06-10 MED ORDER — OXYCODONE HCL 5 MG PO TABS: 5.0000 mg | ORAL_TABLET | Freq: Three times a day (TID) | ORAL | 0 refills | Status: AC | PRN

## 2024-06-10 NOTE — Progress Notes (Signed)
 Patient ID: Amanda Woodard, female    DOB: 1966-03-21, 58 y.o.   MRN: [id number]  Chief Complaint  Patient presents with   Pre-op Exam     CE is in sx//PreOP: Bil reduction       ICD-10-CM   1. Symptomatic mammary hypertrophy  N62        History of Present Illness: Amanda Woodard is a 58 y.o.  female  with a history of macromastia.  She presents for preoperative evaluation for upcoming procedure, Bilateral Breast Reduction with liposuction, scheduled for 06/20/2024 with Dr.  Lowery  The patient has not had problems with anesthesia.  Patient states that she has never had breast cancer before.  She reports that her mother has had breast cancer.  She denies any history of cardiac disease.  She denies taking any blood thinners.  Patient reports she is not a smoker.  Patient states that she currently has Mirena IUD in place.  She denies any history of miscarriages.  She denies any personal history of blood clots.  She reports that her mom had a TIA.  She denies any personal or family history of clotting diseases.  She denies any recent surgeries, traumas or infections.  She denies any history of stroke or heart attack.  She denies any history of Crohn's disease or ulcerative colitis, COPD or asthma.  She denies any history of cancer.  She denies any varicosities to her lower extremities.  She denies any recent fevers, chills or changes in her health.  Patient's heart rate and blood pressure elevated in clinic today.  She states that she was mowing her lawn today and rushed over here.  She states that she has not drank a lot of water today.  She denies any lightheadedness, dizziness, chest pain, or shortness of breath.  Patient also states that she forgot to take her blood pressure medication all weekend, but did take it today.  She states that recently she has been stressed and really busy.  Patient's heart rate was taken later in the visit today and came down significantly.  I encouraged her to  continue to drink water.  I also discussed with her that if her blood pressure remains high, she should reach out to her PCP.  Patient expressed understanding.  Summary of Previous Visit: Patient was seen by Dr. Lowery on 04/26/2024.  She complained of upper back and neck pain due to her enlarged breasts.  On exam, her STN was 33 cm on the right and 34 cm on the left.  Her BMI was 31.6 kg/m.  Her preoperative bra size was a DDD cup.  The estimated amount of breast tissue to be removed bilaterally was 550-600 g.  Patient was found to be a good candidate for bilateral breast reduction with liposuction.  Estimated excess breast tissue to be removed at time of surgery: 550-600 grams  Job: Works as a Financial controller, planning to take 6 weeks off.  PMH Significant for: Hypertension, anxiety, macromastia   Past Medical History: Allergies: No Known Allergies  Current Medications:  Current Outpatient Medications:    acetaminophen  (TYLENOL ) 500 MG tablet, Take 1,000 mg by mouth every 8 (eight) hours as needed (pain)., Disp: , Rfl:    albuterol  (VENTOLIN  HFA) 108 (90 Base) MCG/ACT inhaler, SMARTSIG:2 Puff(s) Via Inhaler 4 Times Daily PRN, Disp: , Rfl:    ALPRAZolam  (XANAX ) 0.5 MG tablet, TAKE 1 TABLET BY MOUTH AT BEDTIME AS NEEDED FOR SLEEP, Disp: 30 tablet, Rfl: 3  Bioflavonoid Products (VITAMIN C) CHEW, Chew 1 tablet by mouth daily., Disp: , Rfl:    cephALEXin  (KEFLEX ) 500 MG capsule, Take 1 capsule (500 mg total) by mouth 4 (four) times daily for 3 days., Disp: 12 capsule, Rfl: 0   Cholecalciferol (VITAMIN D3 PO), Take 1 tablet by mouth daily as needed (immune system boost)., Disp: , Rfl:    diclofenac  (VOLTAREN ) 75 MG EC tablet, Take 1 tablet (75 mg total) by mouth 2 (two) times daily as needed (pain)., Disp: , Rfl:    diclofenac  Sodium (VOLTAREN ) 1 % GEL, Apply 1 application topically 4 (four) times daily as needed (pain)., Disp: , Rfl:    FEXOFENADINE HCL PO, Take 1 tablet by mouth daily as  needed (congestion/allergies)., Disp: , Rfl:    levonorgestrel (MIRENA) 20 MCG/24HR IUD, 1 each by Intrauterine route once. Implanted summer 2021, Disp: , Rfl:    Multiple Vitamins-Minerals (ZINC PO), Take 1 tablet by mouth daily as needed (immune system boost)., Disp: , Rfl:    ondansetron  (ZOFRAN ) 4 MG tablet, Take 1 tablet (4 mg total) by mouth every 8 (eight) hours as needed for up to 20 doses for nausea or vomiting., Disp: 20 tablet, Rfl: 0   OVER THE COUNTER MEDICATION, B12 3000 mcg 1 bid, Disp: , Rfl:    oxyCODONE  (ROXICODONE ) 5 MG immediate release tablet, Take 1 tablet (5 mg total) by mouth every 8 (eight) hours as needed for up to 15 doses for severe pain (pain score 7-10)., Disp: 15 tablet, Rfl: 0   valsartan  (DIOVAN ) 80 MG tablet, Take 1 tablet (80 mg total) by mouth daily. For BP, Disp: 90 tablet, Rfl: 2  Past Medical Problems: Past Medical History:  Diagnosis Date   Anxiety    Vitamin D  deficiency     Past Surgical History: Past Surgical History:  Procedure Laterality Date   BREAST BIOPSY  12/25/2023   MM RT RADIOACTIVE SEED LOC MAMMO GUIDE 12/25/2023 [facility] MAMMO[facility]AP[facility]   CYST EXCISION  2005   HEAD   INCISION AND DRAINAGE PERIRECTAL ABSCESS N/A 04/26/2021   Procedure: IRRIGATION AND DEBRIDEMENT PERIRECTAL ABSCESS;  Surgeon: Rubin Calamity, MD;  Location[facility] OR;  Service: General;  Laterality: N/A;   RADIOACTIVE SEED GUIDED EXCISIONAL BREAST BIOPSY Right 12/26/2023   Procedure: RIGHT BREAST SEED GUIDED EXCISIONAL BIOPSY;  Surgeon: Ebbie Cough, MD;  Location: [facility];  Service: General;  Laterality: Right;  LMA, right    Social History: Social History   Socioeconomic History   Marital status: Married    Spouse name: Not on file   Number of children: Not on file   Years of education: Not on file   Highest education level: Not on file  Occupational History   Not on file  Tobacco Use   Smoking status: Never   Smokeless tobacco: Never  Vaping Use    Vaping status: Never Used  Substance and Sexual Activity   Alcohol use: Yes   Drug use: No   Sexual activity: Yes    Birth control/protection: I.U.D.  Other Topics Concern   Not on file  Social History Narrative   Not on file   Social Drivers of Health   Financial Resource Strain: Low Risk  (03/26/2024)   Received from [facility]   Overall Financial Resource Strain (CARDIA)    Difficulty of Paying Living Expenses: Not hard at all  Food Insecurity: No Food Insecurity (03/26/2024)   Received from [facility]   Hunger Vital Sign  Within the past 12 months, you worried that your food would run out before you got the money to buy more.: Never true    Within the past 12 months, the food you bought just didn't last and you didn't have money to get more.: Never true  Transportation Needs: No Transportation Needs (03/26/2024)   Received from [facility]   PRAPARE - Transportation    Lack of Transportation (Medical): No    Lack of Transportation (Non-Medical): No  Physical Activity: Insufficiently Active (03/26/2024)   Received from [facility]   Exercise Vital Sign    On average, how many days per week do you engage in moderate to strenuous exercise (like a brisk walk)?: 3 days    On average, how many minutes do you engage in exercise at this level?: 20 min  Stress: No Stress Concern Present (03/26/2024)   Received from [facility] - Occupational Stress Questionnaire    Feeling of Stress : Only a little  Social Connections: Socially Integrated (03/26/2024)   Received from [facility]   Social Network    How would you rate your social network (family, work, friends)?: Good participation with social networks  Intimate Partner Violence: Not At Risk (03/26/2024)   Received from [facility]   HITS    Over the last 12 months how often did your partner physically hurt you?: Never    Over the last 12 months how often did your partner insult  you or talk down to you?: Never    Over the last 12 months how often did your partner threaten you with physical harm?: Never    Over the last 12 months how often did your partner scream or curse at you?: Never    Family History: Family History  Problem Relation Age of Onset   Heart disease Father 43   Hyperlipidemia Father    Hypertension Father    COPD Maternal Grandfather    Diabetes Mother    Heart attack Paternal Grandfather    Stroke Maternal Grandmother    Dementia Maternal Grandmother    Obesity Brother    Colon cancer Neg Hx    Esophageal cancer Neg Hx    Rectal cancer Neg Hx    Stomach cancer Neg Hx     Review of Systems: Denies any recent fevers, chills or changes in her health  Physical Exam: Vital Signs BP (!) 142/94   Pulse 100   Ht 5' 5 (1.651 m)   Wt 190 lb (86.2 kg)   BMI 31.62 kg/m   Physical Exam  Constitutional:      General: Not in acute distress.    Appearance: Normal appearance. Not ill-appearing.  HENT:     Head: Normocephalic and atraumatic.  Neck:     Musculoskeletal: Normal range of motion.  Cardiovascular:     Rate and Rhythm: Normal rate Pulmonary:     Effort: Pulmonary effort is normal. No respiratory distress.  Musculoskeletal: Normal range of motion.  Skin:    General: Skin is warm and dry.     Findings: No erythema or rash.  Neurological:     Mental Status: Alert and oriented to person, place, and time. Mental status is at baseline.  Psychiatric:        Mood and Affect: Mood normal.        Behavior: Behavior normal.    Assessment/Plan: The patient is scheduled for bilateral breast reduction with Dr. Lowery.  Risks, benefits,  and alternatives of procedure discussed, questions answered and consent obtained.    Smoking Status: Non-smoker; Counseling Given?  N/A  Last Mammogram: Per chart review, patient had mammogram completed on 09/05/2023 which was found to be incomplete.  She had a later imaging on 09/27/2023 which  showed a suspicious lesion to the right breast.  BI-RADS Category 4: Suspicious.  She then underwent biopsy of the right breast lesion on 10/04/2023 which showed papilloma.  Patient later underwent right breast seed guided excisional biopsy with Dr. Ebbie on 12/26/2023.  The pathology from this procedure showed papilloma and patient was recommended to continue with her routine breast cancer screening and to follow-up with general surgery as needed.  I did discuss with the patient that we typically recommend to wait to have mammograms done 6 months after surgery.  She expressed understanding.  Caprini Score: 7; Risk Factors include: Age, BMI > 25, family history of thrombosis and length of planned surgery. Recommendation for mechanical and possible pharmacological prophylaxis. Encourage early ambulation.  Will discuss possibility of postop Lovenox  with Dr. Lowery.  Pictures obtained: @consult   Post-op Rx sent to pharmacy: Oxycodone , Zofran , Keflex   I instructed the patient to hold multivitamins and supplements at least 1 week prior to surgery.  Discussed with her to hold her Voltaren  1 week prior to surgery.  Discussed with the patient to hold her valsartan  the day of surgery.  Discussed with the patient that she should not take Xanax  at the same time as pain medications as these can be sedating.  Patient expressed understanding.  Patient was provided with the breast reduction and General Surgical Risk consent document and Pain Medication Agreement prior to their appointment.  They had adequate time to read through the risk consent documents and Pain Medication Agreement. We also discussed them in person together during this preop appointment. All of their questions were answered to their satisfaction.  Recommended calling if they have any further questions.  Risk consent form and Pain Medication Agreement to be scanned into patient's chart.  The risk that can be encountered with breast reduction  were discussed and include the following but not limited to these:  Breast asymmetry, fluid accumulation, firmness of the breast, inability to breast feed, loss of nipple or areola, skin loss, decrease or no nipple sensation, fat necrosis of the breast tissue, bleeding, infection, healing delay.  There are risks of anesthesia, changes to skin sensation and injury to nerves or blood vessels.  The muscle can be temporarily or permanently injured.  You may have an allergic reaction to tape, suture, glue, blood products which can result in skin discoloration, swelling, pain, skin lesions, poor healing.  Any of these can lead to the need for revisonal surgery or stage procedures.  A reduction has potential to interfere with diagnostic procedures.  Nipple or breast piercing can increase risks of infection.  This procedure is best done when the breast is fully developed.  Changes in the breast will continue to occur over time.  Pregnancy can alter the outcomes of previous breast reduction surgery, weight gain and weigh loss can also effect the long term appearance.     Electronically signed by: Estefana FORBES Peck, PA-C 06/10/2024 4:19 PM

## 2024-06-10 NOTE — H&P (View-Only) (Signed)
 Patient ID: Amanda Woodard, female    DOB: 1966-03-21, 58 y.o.   MRN: [id number]  Chief Complaint  Patient presents with   Pre-op Exam     CE is in sx//PreOP: Bil reduction       ICD-10-CM   1. Symptomatic mammary hypertrophy  N62        History of Present Illness: Amanda Woodard is a 58 y.o.  female  with a history of macromastia.  She presents for preoperative evaluation for upcoming procedure, Bilateral Breast Reduction with liposuction, scheduled for 06/20/2024 with Dr.  Lowery  The patient has not had problems with anesthesia.  Patient states that she has never had breast cancer before.  She reports that her mother has had breast cancer.  She denies any history of cardiac disease.  She denies taking any blood thinners.  Patient reports she is not a smoker.  Patient states that she currently has Mirena IUD in place.  She denies any history of miscarriages.  She denies any personal history of blood clots.  She reports that her mom had a TIA.  She denies any personal or family history of clotting diseases.  She denies any recent surgeries, traumas or infections.  She denies any history of stroke or heart attack.  She denies any history of Crohn's disease or ulcerative colitis, COPD or asthma.  She denies any history of cancer.  She denies any varicosities to her lower extremities.  She denies any recent fevers, chills or changes in her health.  Patient's heart rate and blood pressure elevated in clinic today.  She states that she was mowing her lawn today and rushed over here.  She states that she has not drank a lot of water today.  She denies any lightheadedness, dizziness, chest pain, or shortness of breath.  Patient also states that she forgot to take her blood pressure medication all weekend, but did take it today.  She states that recently she has been stressed and really busy.  Patient's heart rate was taken later in the visit today and came down significantly.  I encouraged her to  continue to drink water.  I also discussed with her that if her blood pressure remains high, she should reach out to her PCP.  Patient expressed understanding.  Summary of Previous Visit: Patient was seen by Dr. Lowery on 04/26/2024.  She complained of upper back and neck pain due to her enlarged breasts.  On exam, her STN was 33 cm on the right and 34 cm on the left.  Her BMI was 31.6 kg/m.  Her preoperative bra size was a DDD cup.  The estimated amount of breast tissue to be removed bilaterally was 550-600 g.  Patient was found to be a good candidate for bilateral breast reduction with liposuction.  Estimated excess breast tissue to be removed at time of surgery: 550-600 grams  Job: Works as a Financial controller, planning to take 6 weeks off.  PMH Significant for: Hypertension, anxiety, macromastia   Past Medical History: Allergies: No Known Allergies  Current Medications:  Current Outpatient Medications:    acetaminophen  (TYLENOL ) 500 MG tablet, Take 1,000 mg by mouth every 8 (eight) hours as needed (pain)., Disp: , Rfl:    albuterol  (VENTOLIN  HFA) 108 (90 Base) MCG/ACT inhaler, SMARTSIG:2 Puff(s) Via Inhaler 4 Times Daily PRN, Disp: , Rfl:    ALPRAZolam  (XANAX ) 0.5 MG tablet, TAKE 1 TABLET BY MOUTH AT BEDTIME AS NEEDED FOR SLEEP, Disp: 30 tablet, Rfl: 3  Bioflavonoid Products (VITAMIN C) CHEW, Chew 1 tablet by mouth daily., Disp: , Rfl:    cephALEXin  (KEFLEX ) 500 MG capsule, Take 1 capsule (500 mg total) by mouth 4 (four) times daily for 3 days., Disp: 12 capsule, Rfl: 0   Cholecalciferol (VITAMIN D3 PO), Take 1 tablet by mouth daily as needed (immune system boost)., Disp: , Rfl:    diclofenac  (VOLTAREN ) 75 MG EC tablet, Take 1 tablet (75 mg total) by mouth 2 (two) times daily as needed (pain)., Disp: , Rfl:    diclofenac  Sodium (VOLTAREN ) 1 % GEL, Apply 1 application topically 4 (four) times daily as needed (pain)., Disp: , Rfl:    FEXOFENADINE HCL PO, Take 1 tablet by mouth daily as  needed (congestion/allergies)., Disp: , Rfl:    levonorgestrel (MIRENA) 20 MCG/24HR IUD, 1 each by Intrauterine route once. Implanted summer 2021, Disp: , Rfl:    Multiple Vitamins-Minerals (ZINC PO), Take 1 tablet by mouth daily as needed (immune system boost)., Disp: , Rfl:    ondansetron  (ZOFRAN ) 4 MG tablet, Take 1 tablet (4 mg total) by mouth every 8 (eight) hours as needed for up to 20 doses for nausea or vomiting., Disp: 20 tablet, Rfl: 0   OVER THE COUNTER MEDICATION, B12 3000 mcg 1 bid, Disp: , Rfl:    oxyCODONE  (ROXICODONE ) 5 MG immediate release tablet, Take 1 tablet (5 mg total) by mouth every 8 (eight) hours as needed for up to 15 doses for severe pain (pain score 7-10)., Disp: 15 tablet, Rfl: 0   valsartan  (DIOVAN ) 80 MG tablet, Take 1 tablet (80 mg total) by mouth daily. For BP, Disp: 90 tablet, Rfl: 2  Past Medical Problems: Past Medical History:  Diagnosis Date   Anxiety    Vitamin D  deficiency     Past Surgical History: Past Surgical History:  Procedure Laterality Date   BREAST BIOPSY  12/25/2023   MM RT RADIOACTIVE SEED LOC MAMMO GUIDE 12/25/2023 [facility]   CYST EXCISION  2005   HEAD   INCISION AND DRAINAGE PERIRECTAL ABSCESS N/A 04/26/2021   Procedure: IRRIGATION AND DEBRIDEMENT PERIRECTAL ABSCESS;  Surgeon: Rubin Calamity, MD;  Location[facility] OR;  Service: General;  Laterality: N/A;   RADIOACTIVE SEED GUIDED EXCISIONAL BREAST BIOPSY Right 12/26/2023   Procedure: RIGHT BREAST SEED GUIDED EXCISIONAL BIOPSY;  Surgeon: Ebbie Cough, MD;  Location: [facility];  Service: General;  Laterality: Right;  LMA, right    Social History: Social History   Socioeconomic History   Marital status: Married    Spouse name: Not on file   Number of children: Not on file   Years of education: Not on file   Highest education level: Not on file  Occupational History   Not on file  Tobacco Use   Smoking status: Never   Smokeless tobacco: Never  Vaping Use    Vaping status: Never Used  Substance and Sexual Activity   Alcohol use: Yes   Drug use: No   Sexual activity: Yes    Birth control/protection: I.U.D.  Other Topics Concern   Not on file  Social History Narrative   Not on file   Social Drivers of Health   Financial Resource Strain: Low Risk  (03/26/2024)   Received from [facility]   Overall Financial Resource Strain (CARDIA)    Difficulty of Paying Living Expenses: Not hard at all  Food Insecurity: No Food Insecurity (03/26/2024)   Received from [facility]   Hunger Vital Sign  Within the past 12 months, you worried that your food would run out before you got the money to buy more.: Never true    Within the past 12 months, the food you bought just didn't last and you didn't have money to get more.: Never true  Transportation Needs: No Transportation Needs (03/26/2024)   Received from [facility]   PRAPARE - Transportation    Lack of Transportation (Medical): No    Lack of Transportation (Non-Medical): No  Physical Activity: Insufficiently Active (03/26/2024)   Received from [facility]   Exercise Vital Sign    On average, how many days per week do you engage in moderate to strenuous exercise (like a brisk walk)?: 3 days    On average, how many minutes do you engage in exercise at this level?: 20 min  Stress: No Stress Concern Present (03/26/2024)   Received from [facility] - Occupational Stress Questionnaire    Feeling of Stress : Only a little  Social Connections: Socially Integrated (03/26/2024)   Received from [facility]   Social Network    How would you rate your social network (family, work, friends)?: Good participation with social networks  Intimate Partner Violence: Not At Risk (03/26/2024)   Received from [facility]   HITS    Over the last 12 months how often did your partner physically hurt you?: Never    Over the last 12 months how often did your partner insult  you or talk down to you?: Never    Over the last 12 months how often did your partner threaten you with physical harm?: Never    Over the last 12 months how often did your partner scream or curse at you?: Never    Family History: Family History  Problem Relation Age of Onset   Heart disease Father 43   Hyperlipidemia Father    Hypertension Father    COPD Maternal Grandfather    Diabetes Mother    Heart attack Paternal Grandfather    Stroke Maternal Grandmother    Dementia Maternal Grandmother    Obesity Brother    Colon cancer Neg Hx    Esophageal cancer Neg Hx    Rectal cancer Neg Hx    Stomach cancer Neg Hx     Review of Systems: Denies any recent fevers, chills or changes in her health  Physical Exam: Vital Signs BP (!) 142/94   Pulse 100   Ht 5' 5 (1.651 m)   Wt 190 lb (86.2 kg)   BMI 31.62 kg/m   Physical Exam  Constitutional:      General: Not in acute distress.    Appearance: Normal appearance. Not ill-appearing.  HENT:     Head: Normocephalic and atraumatic.  Neck:     Musculoskeletal: Normal range of motion.  Cardiovascular:     Rate and Rhythm: Normal rate Pulmonary:     Effort: Pulmonary effort is normal. No respiratory distress.  Musculoskeletal: Normal range of motion.  Skin:    General: Skin is warm and dry.     Findings: No erythema or rash.  Neurological:     Mental Status: Alert and oriented to person, place, and time. Mental status is at baseline.  Psychiatric:        Mood and Affect: Mood normal.        Behavior: Behavior normal.    Assessment/Plan: The patient is scheduled for bilateral breast reduction with Dr. Lowery.  Risks, benefits,  and alternatives of procedure discussed, questions answered and consent obtained.    Smoking Status: Non-smoker; Counseling Given?  N/A  Last Mammogram: Per chart review, patient had mammogram completed on 09/05/2023 which was found to be incomplete.  She had a later imaging on 09/27/2023 which  showed a suspicious lesion to the right breast.  BI-RADS Category 4: Suspicious.  She then underwent biopsy of the right breast lesion on 10/04/2023 which showed papilloma.  Patient later underwent right breast seed guided excisional biopsy with Dr. Ebbie on 12/26/2023.  The pathology from this procedure showed papilloma and patient was recommended to continue with her routine breast cancer screening and to follow-up with general surgery as needed.  I did discuss with the patient that we typically recommend to wait to have mammograms done 6 months after surgery.  She expressed understanding.  Caprini Score: 7; Risk Factors include: Age, BMI > 25, family history of thrombosis and length of planned surgery. Recommendation for mechanical and possible pharmacological prophylaxis. Encourage early ambulation.  Will discuss possibility of postop Lovenox  with Dr. Lowery.  Pictures obtained: @consult   Post-op Rx sent to pharmacy: Oxycodone , Zofran , Keflex   I instructed the patient to hold multivitamins and supplements at least 1 week prior to surgery.  Discussed with her to hold her Voltaren  1 week prior to surgery.  Discussed with the patient to hold her valsartan  the day of surgery.  Discussed with the patient that she should not take Xanax  at the same time as pain medications as these can be sedating.  Patient expressed understanding.  Patient was provided with the breast reduction and General Surgical Risk consent document and Pain Medication Agreement prior to their appointment.  They had adequate time to read through the risk consent documents and Pain Medication Agreement. We also discussed them in person together during this preop appointment. All of their questions were answered to their satisfaction.  Recommended calling if they have any further questions.  Risk consent form and Pain Medication Agreement to be scanned into patient's chart.  The risk that can be encountered with breast reduction  were discussed and include the following but not limited to these:  Breast asymmetry, fluid accumulation, firmness of the breast, inability to breast feed, loss of nipple or areola, skin loss, decrease or no nipple sensation, fat necrosis of the breast tissue, bleeding, infection, healing delay.  There are risks of anesthesia, changes to skin sensation and injury to nerves or blood vessels.  The muscle can be temporarily or permanently injured.  You may have an allergic reaction to tape, suture, glue, blood products which can result in skin discoloration, swelling, pain, skin lesions, poor healing.  Any of these can lead to the need for revisonal surgery or stage procedures.  A reduction has potential to interfere with diagnostic procedures.  Nipple or breast piercing can increase risks of infection.  This procedure is best done when the breast is fully developed.  Changes in the breast will continue to occur over time.  Pregnancy can alter the outcomes of previous breast reduction surgery, weight gain and weigh loss can also effect the long term appearance.     Electronically signed by: Estefana FORBES Peck, PA-C 06/10/2024 4:19 PM

## 2024-06-13 ENCOUNTER — Encounter (HOSPITAL_BASED_OUTPATIENT_CLINIC_OR_DEPARTMENT_OTHER): Payer: Self-pay | Admitting: Plastic Surgery

## 2024-06-13 ENCOUNTER — Other Ambulatory Visit: Payer: Self-pay

## 2024-06-18 ENCOUNTER — Encounter (HOSPITAL_BASED_OUTPATIENT_CLINIC_OR_DEPARTMENT_OTHER)
Admission: RE | Admit: 2024-06-18 | Discharge: 2024-06-18 | Disposition: A | Discharge status: Home / Self Care | Source: Ambulatory Visit | Attending: Plastic Surgery | Admitting: Plastic Surgery

## 2024-06-18 DIAGNOSIS — Z01818 Encounter for other preprocedural examination: Secondary | ICD-10-CM | POA: Diagnosis present

## 2024-06-20 ENCOUNTER — Ambulatory Visit (HOSPITAL_BASED_OUTPATIENT_CLINIC_OR_DEPARTMENT_OTHER)
Admission: RE | Admit: 2024-06-20 | Discharge: 2024-06-20 | Disposition: A | Discharge status: Home / Self Care | Attending: Plastic Surgery | Admitting: Plastic Surgery

## 2024-06-20 ENCOUNTER — Encounter (HOSPITAL_BASED_OUTPATIENT_CLINIC_OR_DEPARTMENT_OTHER)
Admission: RE | Disposition: A | Discharge status: Home / Self Care | Payer: Self-pay | Source: Home / Self Care | Attending: Plastic Surgery

## 2024-06-20 ENCOUNTER — Ambulatory Visit (HOSPITAL_BASED_OUTPATIENT_CLINIC_OR_DEPARTMENT_OTHER): Admitting: Certified Registered Nurse Anesthetist

## 2024-06-20 ENCOUNTER — Other Ambulatory Visit: Payer: Self-pay

## 2024-06-20 ENCOUNTER — Encounter (HOSPITAL_BASED_OUTPATIENT_CLINIC_OR_DEPARTMENT_OTHER): Payer: Self-pay | Admitting: Plastic Surgery

## 2024-06-20 DIAGNOSIS — M542 Cervicalgia: Secondary | ICD-10-CM | POA: Diagnosis not present

## 2024-06-20 DIAGNOSIS — E66813 Obesity, class 3: Secondary | ICD-10-CM | POA: Insufficient documentation

## 2024-06-20 DIAGNOSIS — Z79899 Other long term (current) drug therapy: Secondary | ICD-10-CM | POA: Diagnosis not present

## 2024-06-20 DIAGNOSIS — F32A Depression, unspecified: Secondary | ICD-10-CM | POA: Diagnosis not present

## 2024-06-20 DIAGNOSIS — M549 Dorsalgia, unspecified: Secondary | ICD-10-CM | POA: Insufficient documentation

## 2024-06-20 DIAGNOSIS — Z6831 Body mass index (BMI) 31.0-31.9, adult: Secondary | ICD-10-CM | POA: Insufficient documentation

## 2024-06-20 DIAGNOSIS — Z01818 Encounter for other preprocedural examination: Secondary | ICD-10-CM

## 2024-06-20 DIAGNOSIS — F419 Anxiety disorder, unspecified: Secondary | ICD-10-CM | POA: Diagnosis not present

## 2024-06-20 DIAGNOSIS — N62 Hypertrophy of breast: Secondary | ICD-10-CM | POA: Insufficient documentation

## 2024-06-20 DIAGNOSIS — Z803 Family history of malignant neoplasm of breast: Secondary | ICD-10-CM | POA: Diagnosis not present

## 2024-06-20 DIAGNOSIS — Z975 Presence of (intrauterine) contraceptive device: Secondary | ICD-10-CM | POA: Diagnosis not present

## 2024-06-20 DIAGNOSIS — I10 Essential (primary) hypertension: Secondary | ICD-10-CM | POA: Diagnosis not present

## 2024-06-20 HISTORY — PX: BREAST REDUCTION SURGERY: SHX8

## 2024-06-20 LAB — POCT PREGNANCY, URINE: Preg Test, Ur: NEGATIVE

## 2024-06-20 SURGERY — BREAST REDUCTION WITH LIPOSUCTION
Anesthesia: General | Site: Breast | Laterality: Bilateral

## 2024-06-20 MED ORDER — PROPOFOL 1000 MG/100ML IV EMUL
INTRAVENOUS | Status: AC
  Filled 2024-06-20: qty 100

## 2024-06-20 MED ORDER — FENTANYL CITRATE (PF) 100 MCG/2ML IJ SOLN
INTRAMUSCULAR | Status: DC | PRN
  Administered 2024-06-20 (×4): 50 ug via INTRAVENOUS

## 2024-06-20 MED ORDER — ONDANSETRON HCL 4 MG/2ML IJ SOLN
INTRAMUSCULAR | Status: AC
  Filled 2024-06-20: qty 2

## 2024-06-20 MED ORDER — OXYCODONE HCL 5 MG PO TABS
5.0000 mg | ORAL_TABLET | Freq: Once | ORAL | Status: AC | PRN
  Administered 2024-06-20: 5 mg via ORAL

## 2024-06-20 MED ORDER — FENTANYL CITRATE (PF) 100 MCG/2ML IJ SOLN: 25.0000 ug | INTRAMUSCULAR | Status: DC | PRN

## 2024-06-20 MED ORDER — CHLORHEXIDINE GLUCONATE CLOTH 2 % EX PADS: 6.0000 | MEDICATED_PAD | Freq: Once | CUTANEOUS | Status: DC

## 2024-06-20 MED ORDER — OXYCODONE HCL 5 MG PO TABS
ORAL_TABLET | ORAL | Status: AC
  Filled 2024-06-20: qty 1

## 2024-06-20 MED ORDER — MEPERIDINE HCL 25 MG/ML IJ SOLN: 6.2500 mg | INTRAMUSCULAR | Status: DC | PRN

## 2024-06-20 MED ORDER — DEXAMETHASONE SODIUM PHOSPHATE 10 MG/ML IJ SOLN
INTRAMUSCULAR | Status: AC
  Filled 2024-06-20: qty 1

## 2024-06-20 MED ORDER — HYDROMORPHONE HCL 1 MG/ML IJ SOLN
INTRAMUSCULAR | Status: AC
  Filled 2024-06-20: qty 0.5

## 2024-06-20 MED ORDER — NITROGLYCERIN 2 % TD OINT
TOPICAL_OINTMENT | TRANSDERMAL | Status: DC | PRN
  Administered 2024-06-20: .5 [in_us] via TOPICAL

## 2024-06-20 MED ORDER — LIDOCAINE 2% (20 MG/ML) 5 ML SYRINGE
INTRAMUSCULAR | Status: AC
  Filled 2024-06-20: qty 5

## 2024-06-20 MED ORDER — MIDAZOLAM HCL 2 MG/2ML IJ SOLN
INTRAMUSCULAR | Status: AC
  Filled 2024-06-20: qty 2

## 2024-06-20 MED ORDER — SODIUM CHLORIDE 0.9 % IV SOLN: 250.0000 mL | INTRAVENOUS | Status: DC | PRN

## 2024-06-20 MED ORDER — SODIUM CHLORIDE 0.9% FLUSH
3.0000 mL | Freq: Two times a day (BID) | INTRAVENOUS | Status: DC
Start: 2024-06-20 — End: 2024-06-20

## 2024-06-20 MED ORDER — AMISULPRIDE (ANTIEMETIC) 5 MG/2ML IV SOLN: 10.0000 mg | Freq: Once | INTRAVENOUS | Status: DC | PRN

## 2024-06-20 MED ORDER — LIDOCAINE HCL (CARDIAC) PF 100 MG/5ML IV SOSY
PREFILLED_SYRINGE | INTRAVENOUS | Status: DC | PRN
  Administered 2024-06-20: 50 mg via INTRAVENOUS

## 2024-06-20 MED ORDER — CEFAZOLIN SODIUM-DEXTROSE 2-4 GM/100ML-% IV SOLN
2.0000 g | INTRAVENOUS | Status: AC
  Administered 2024-06-20: 2 g via INTRAVENOUS

## 2024-06-20 MED ORDER — PROPOFOL 10 MG/ML IV BOLUS
INTRAVENOUS | Status: DC | PRN
  Administered 2024-06-20: 200 mg via INTRAVENOUS

## 2024-06-20 MED ORDER — PROPOFOL 10 MG/ML IV BOLUS
INTRAVENOUS | Status: AC
  Filled 2024-06-20: qty 20

## 2024-06-20 MED ORDER — PROPOFOL 500 MG/50ML IV EMUL
INTRAVENOUS | Status: DC | PRN
  Administered 2024-06-20: 50 ug/kg/min via INTRAVENOUS

## 2024-06-20 MED ORDER — LACTATED RINGERS IV SOLN: INTRAVENOUS | Status: DC | PRN

## 2024-06-20 MED ORDER — PHENYLEPHRINE HCL (PRESSORS) 10 MG/ML IV SOLN
INTRAVENOUS | Status: DC | PRN
  Administered 2024-06-20: 160 ug via INTRAVENOUS
  Administered 2024-06-20 (×3): 80 ug via INTRAVENOUS
  Administered 2024-06-20: 160 ug via INTRAVENOUS
  Administered 2024-06-20: 80 ug via INTRAVENOUS

## 2024-06-20 MED ORDER — LACTATED RINGERS IV SOLN
INTRAVENOUS | Status: DC
Start: 2024-06-20 — End: 2024-06-20

## 2024-06-20 MED ORDER — OXYCODONE HCL 5 MG/5ML PO SOLN: 5.0000 mg | Freq: Once | ORAL | Status: AC | PRN

## 2024-06-20 MED ORDER — LIDOCAINE HCL 1 % IJ SOLN
INTRAVENOUS | Status: DC | PRN
  Administered 2024-06-20: 500 mL

## 2024-06-20 MED ORDER — SODIUM CHLORIDE 0.9 % IV SOLN: 12.5000 mg | INTRAVENOUS | Status: DC | PRN

## 2024-06-20 MED ORDER — LIDOCAINE-EPINEPHRINE 1 %-1:100000 IJ SOLN
INTRAMUSCULAR | Status: DC | PRN
  Administered 2024-06-20: 38 mL

## 2024-06-20 MED ORDER — OXYCODONE HCL 5 MG PO TABS: 5.0000 mg | ORAL_TABLET | ORAL | Status: DC | PRN

## 2024-06-20 MED ORDER — HYDROMORPHONE HCL 1 MG/ML IJ SOLN
0.2500 mg | INTRAMUSCULAR | Status: DC | PRN
  Administered 2024-06-20 (×2): 0.5 mg via INTRAVENOUS

## 2024-06-20 MED ORDER — FENTANYL CITRATE (PF) 100 MCG/2ML IJ SOLN
INTRAMUSCULAR | Status: AC
  Filled 2024-06-20: qty 2

## 2024-06-20 MED ORDER — VASHE WOUND IRRIGATION OPTIME
TOPICAL | Status: DC | PRN
  Administered 2024-06-20: 34 [oz_av]

## 2024-06-20 MED ORDER — MIDAZOLAM HCL 5 MG/5ML IJ SOLN
INTRAMUSCULAR | Status: DC | PRN
  Administered 2024-06-20: 2 mg via INTRAVENOUS

## 2024-06-20 MED ORDER — SODIUM CHLORIDE 0.9% FLUSH: 3.0000 mL | INTRAVENOUS | Status: DC | PRN

## 2024-06-20 MED ORDER — ACETAMINOPHEN 10 MG/ML IV SOLN
INTRAVENOUS | Status: AC
  Filled 2024-06-20: qty 100

## 2024-06-20 MED ORDER — ONDANSETRON HCL 4 MG/2ML IJ SOLN
INTRAMUSCULAR | Status: DC | PRN
  Administered 2024-06-20: 4 mg via INTRAVENOUS

## 2024-06-20 MED ORDER — BUPIVACAINE LIPOSOME 1.3 % IJ SUSP
INTRAMUSCULAR | Status: DC | PRN
  Administered 2024-06-20: 40 mL

## 2024-06-20 MED ORDER — ACETAMINOPHEN 10 MG/ML IV SOLN
INTRAVENOUS | Status: DC | PRN
  Administered 2024-06-20: 1000 mg via INTRAVENOUS

## 2024-06-20 MED ORDER — ACETAMINOPHEN 325 MG PO TABS: 650.0000 mg | ORAL_TABLET | ORAL | Status: DC | PRN

## 2024-06-20 MED ORDER — NITROGLYCERIN 2 % TD OINT
TOPICAL_OINTMENT | TRANSDERMAL | Status: AC
  Filled 2024-06-20: qty 30

## 2024-06-20 MED ORDER — DEXAMETHASONE SODIUM PHOSPHATE 4 MG/ML IJ SOLN
INTRAMUSCULAR | Status: DC | PRN
  Administered 2024-06-20: 5 mg via INTRAVENOUS

## 2024-06-20 MED ORDER — EPHEDRINE SULFATE (PRESSORS) 50 MG/ML IJ SOLN
INTRAMUSCULAR | Status: DC | PRN
  Administered 2024-06-20: 5 mg via INTRAVENOUS

## 2024-06-20 MED ORDER — TRANEXAMIC ACID-NACL 1000-0.7 MG/100ML-% IV SOLN
INTRAVENOUS | Status: AC
  Filled 2024-06-20: qty 100

## 2024-06-20 MED ORDER — TRANEXAMIC ACID-NACL 1000-0.7 MG/100ML-% IV SOLN
INTRAVENOUS | Status: DC | PRN
Start: 2024-06-20 — End: 2024-06-20
  Administered 2024-06-20: 1000 mg via INTRAVENOUS

## 2024-06-20 MED ORDER — ACETAMINOPHEN 325 MG RE SUPP: 650.0000 mg | RECTAL | Status: DC | PRN

## 2024-06-20 SURGICAL SUPPLY — 65 items
BINDER BREAST LRG (GAUZE/BANDAGES/DRESSINGS) IMPLANT
BINDER BREAST MEDIUM (GAUZE/BANDAGES/DRESSINGS) IMPLANT
BINDER BREAST XLRG (GAUZE/BANDAGES/DRESSINGS) IMPLANT
BINDER BREAST XXLRG (GAUZE/BANDAGES/DRESSINGS) IMPLANT
BIOPATCH RED 1 DISK 7.0 (GAUZE/BANDAGES/DRESSINGS) IMPLANT
BLADE HEX COATED 2.75 (ELECTRODE) IMPLANT
BLADE KNIFE PERSONA 10 (BLADE) ×2 IMPLANT
BLADE SURG 15 STRL LF DISP TIS (BLADE) ×1 IMPLANT
CANISTER SUCT 1200ML W/VALVE (MISCELLANEOUS) ×1 IMPLANT
CLEANSER WND VASHE 34 (WOUND CARE) ×1 IMPLANT
COTTONBALL LRG STERILE PKG (GAUZE/BANDAGES/DRESSINGS) IMPLANT
COVER BACK TABLE 60X90IN (DRAPES) ×1 IMPLANT
COVER MAYO STAND STRL (DRAPES) ×1 IMPLANT
DERMABOND ADVANCED .7 DNX12 (GAUZE/BANDAGES/DRESSINGS) ×2 IMPLANT
DRAIN CHANNEL 15F RND FF W/TCR (WOUND CARE) IMPLANT
DRAIN CHANNEL 19F RND (DRAIN) IMPLANT
DRAPE LAPAROSCOPIC ABDOMINAL (DRAPES) ×1 IMPLANT
DRAPE UTILITY XL STRL (DRAPES) ×1 IMPLANT
DRSG MEPILEX POST OP 4X8 (GAUZE/BANDAGES/DRESSINGS) ×2 IMPLANT
DRSG TEGADERM 4X4.75 (GAUZE/BANDAGES/DRESSINGS) IMPLANT
DRSG TELFA 3X8 NADH STRL (GAUZE/BANDAGES/DRESSINGS) IMPLANT
ELECTRODE BLDE 4.0 EZ CLN MEGD (MISCELLANEOUS) ×1 IMPLANT
ELECTRODE REM PT RTRN 9FT ADLT (ELECTROSURGICAL) ×1 IMPLANT
EVACUATOR SILICONE 100CC (DRAIN) IMPLANT
GAUZE PAD ABD 8X10 STRL (GAUZE/BANDAGES/DRESSINGS) ×2 IMPLANT
GAUZE XEROFORM 5X9 LF (GAUZE/BANDAGES/DRESSINGS) IMPLANT
GLOVE BIO SURGEON STRL SZ 6.5 (GLOVE) ×2 IMPLANT
GLOVE BIO SURGEON STRL SZ7.5 (GLOVE) ×1 IMPLANT
GLOVE BIOGEL PI IND STRL 7.0 (GLOVE) IMPLANT
GLOVE BIOGEL PI IND STRL 8 (GLOVE) IMPLANT
GOWN STRL REUS W/ TWL LRG LVL3 (GOWN DISPOSABLE) ×2 IMPLANT
GOWN STRL REUS W/ TWL XL LVL3 (GOWN DISPOSABLE) IMPLANT
LINER CANISTER 1000CC FLEX (MISCELLANEOUS) ×1 IMPLANT
NDL FILTER BLUNT 18X1 1/2 (NEEDLE) IMPLANT
NDL HYPO 25X1 1.5 SAFETY (NEEDLE) ×2 IMPLANT
NEEDLE FILTER BLUNT 18X1 1/2 (NEEDLE) ×1 IMPLANT
NEEDLE HYPO 25X1 1.5 SAFETY (NEEDLE) ×2 IMPLANT
NS IRRIG 1000ML POUR BTL (IV SOLUTION) IMPLANT
PACK BASIN DAY SURGERY FS (CUSTOM PROCEDURE TRAY) ×1 IMPLANT
PAD ALCOHOL SWAB (MISCELLANEOUS) IMPLANT
PAD FOAM SILICONE BACKED (GAUZE/BANDAGES/DRESSINGS) IMPLANT
PENCIL SMOKE EVACUATOR (MISCELLANEOUS) ×1 IMPLANT
PIN SAFETY STERILE (MISCELLANEOUS) IMPLANT
POWDER MYRIAD MORCLLS FINE 500 (Miscellaneous) IMPLANT
SLEEVE SCD COMPRESS KNEE MED (STOCKING) ×1 IMPLANT
SPIKE FLUID TRANSFER (MISCELLANEOUS) IMPLANT
SPONGE T-LAP 18X18 ~~LOC~~+RFID (SPONGE) ×2 IMPLANT
STRIP SUTURE WOUND CLOSURE 1/2 (MISCELLANEOUS) ×4 IMPLANT
SUT MNCRL AB 4-0 PS2 18 (SUTURE) ×4 IMPLANT
SUT MON AB 3-0 SH27 (SUTURE) ×4 IMPLANT
SUT MON AB 5-0 PS2 18 (SUTURE) IMPLANT
SUT PDS II 3-0 CT2 27 ABS (SUTURE) ×4 IMPLANT
SUT SILK 3 0 PS 1 (SUTURE) IMPLANT
SUT VIC AB 4-0 PS2 27 (SUTURE) IMPLANT
SYR 50ML LL SCALE MARK (SYRINGE) IMPLANT
SYR BULB IRRIG 60ML STRL (SYRINGE) ×1 IMPLANT
SYR CONTROL 10ML LL (SYRINGE) ×2 IMPLANT
TAPE MEASURE VINYL STERILE (MISCELLANEOUS) IMPLANT
TOWEL GREEN STERILE FF (TOWEL DISPOSABLE) ×3 IMPLANT
TRAY DSU PREP LF (CUSTOM PROCEDURE TRAY) ×1 IMPLANT
TUBE CONNECTING 20X1/4 (TUBING) ×1 IMPLANT
TUBING INFILTRATION IT-10001 (TUBING) IMPLANT
TUBING SET GRADUATE ASPIR 12FT (MISCELLANEOUS) IMPLANT
UNDERPAD 30X36 HEAVY ABSORB (UNDERPADS AND DIAPERS) ×2 IMPLANT
YANKAUER SUCT BULB TIP NO VENT (SUCTIONS) ×1 IMPLANT

## 2024-06-20 NOTE — Anesthesia Procedure Notes (Signed)
 Procedure Name: LMA Insertion Date/Time: 06/20/2024 10:48 AM  Performed by: Buster Catheryn SAUNDERS, CRNAPre-anesthesia Checklist: Patient identified, Emergency Drugs available, Suction available and Patient being monitored Patient Re-evaluated:Patient Re-evaluated prior to induction Oxygen Delivery Method: Circle system utilized Preoxygenation: Pre-oxygenation with 100% oxygen Induction Type: IV induction Ventilation: Mask ventilation without difficulty LMA: LMA inserted LMA Size: 4.0 Number of attempts: 1 Placement Confirmation: positive ETCO2 Tube secured with: Tape Dental Injury: Teeth and Oropharynx as per pre-operative assessment

## 2024-06-20 NOTE — Discharge Instructions (Addendum)
 INSTRUCTIONS FOR AFTER BREAST SURGERY   You will likely have some questions about what to expect following your operation.  The following information will help you and your family understand what to expect when you are discharged from the hospital.  It is important to follow these guidelines to help ensure a smooth recovery and reduce complication.  Postoperative instructions include information on: diet, wound care, medications and physical activity.  AFTER SURGERY Expect to go home after the procedure.  In some cases, you may need to spend one night in the hospital for observation.  DIET Breast surgery does not require a specific diet.  However, the healthier you eat the better your body will heal. It is important to increasing your protein intake.  This means limiting the foods with sugar and carbohydrates.  Focus on vegetables and some meat.  If you have liposuction during your procedure be sure to drink water.  If your urine is bright yellow, then it is concentrated, and you need to drink more water.  As a general rule after surgery, you should have 8 ounces of water every hour while awake.  If you find you are persistently nauseated or unable to take in liquids let us  know.  NO TOBACCO USE or EXPOSURE.  This will slow your healing process and lead to a wound.  WOUND CARE Leave the binder on for 3 days . Use fragrance free soap like Dial, Dove or [place].   After 3 days you can remove the binder to shower. Once dry apply binder or sports bra. If you have liposuction you will have a soft and spongy dressing (Lipofoam) that helps prevent creases in your skin.  Remove before you shower and then replace it.  It is also available on [employer]. If you have steri-strips / tape directly attached to your skin leave them in place. It is OK to get these wet.   No baths, pools or hot tubs for four weeks. We close your incision to leave the smallest and best-looking scar. No ointment or creams on your incisions  for four weeks.  No Neosporin (Too many skin reactions).  A few weeks after surgery you can use Mederma and start massaging the scar. We ask you to wear your binder or sports bra for the first 6 weeks around the clock, including while sleeping. This provides added comfort and helps reduce the fluid accumulation at the surgery site. NO Ice or heating pads to the operative site.  You have a very high risk of a BURN before you feel the temperature change.  ACTIVITY No heavy lifting until cleared by the doctor.  This usually means no more than a half-gallon of milk.  It is OK to walk and climb stairs. Moving your legs is very important to decrease your risk of a blood clot.  It will also help keep you from getting deconditioned.  Every 1 to 2 hours get up and walk for 5 minutes. This will help with a quicker recovery back to normal.  Let pain be your guide so you don't do too much.  This time is for you to recover.  You will be more comfortable if you sleep and rest with your head elevated either with a few pillows under you or in a recliner.  No stomach sleeping for a three months.  WORK Everyone returns to work at different times. As a rough guide, most people take at least 1 - 2 weeks off prior to returning to work. If  you need documentation for your job, give the forms to the front staff at the clinic.  DRIVING Arrange for someone to bring you home from the hospital after your surgery.  You may be able to drive a few days after surgery but not while taking any narcotics or valium.  BOWEL MOVEMENTS Constipation can occur after anesthesia and while taking pain medication.  It is important to stay ahead for your comfort.  We recommend taking Milk of Magnesia (2 tablespoons; twice a day) while taking the pain pills.  MEDICATIONS You may be prescribed should start after surgery At your preoperative visit for you history and physical you were given the following medications: Antibiotic: Start this  medication when you get home and take according to the instructions on the bottle. Zofran  4 mg:  This is to treat nausea and vomiting.  You can take this every 6 hours as needed and only if needed. Oxycodone  5 mg every 6 hours for 3 - 5 days.  This is to be used after you have taken the Motrin or the Tylenol . 4.   Gabapentin 300 mg every 12 hours for 7 days.  Over the counter Medication to take: Ibuprofen (Motrin) 400 - 600 mg every 6 hour for 7 days Tylenol  500 mg every 6 hours for 7 days.  Only take the Oxycodone  after you have tried these two. MiraLAX or stool softener of choice: Take this according to the bottle if you take the Norco.  If muscle work done:  Flexeril 5 mg every 12 hours for 7 days.  WHEN TO CALL Call your surgeon's office if any of the following occur: Fever 101 degrees F or greater Excessive bleeding or fluid from the incision site. Pain that increases over time without aid from the medications Redness, warmth, or pus draining from incision sites Persistent nausea or inability to take in liquids Severe misshapen area that underwent the operation.    Post Anesthesia Home Care Instructions  Activity: Get plenty of rest for the remainder of the day. A responsible individual must stay with you for 24 hours following the procedure.  For the next 24 hours, DO NOT: -Drive a car -Advertising copywriter -Drink alcoholic beverages -Take any medication unless instructed by your physician -Make any legal decisions or sign important papers.  Meals: Start with liquid foods such as gelatin or soup. Progress to regular foods as tolerated. Avoid greasy, spicy, heavy foods. If nausea and/or vomiting occur, drink only clear liquids until the nausea and/or vomiting subsides. Call your physician if vomiting continues.  Special Instructions/Symptoms: Your throat may feel dry or sore from the anesthesia or the breathing tube placed in your throat during surgery. If this causes  discomfort, gargle with warm salt water. The discomfort should disappear within 24 hours.  Information for Discharge Teaching: EXPAREL  (bupivacaine  liposome injectable suspension)   Pain relief is important to your recovery. The goal is to control your pain so you can move easier and return to your normal activities as soon as possible after your procedure. Your physician may use several types of medicines to manage pain, swelling, and more.  Your surgeon or anesthesiologist gave you EXPAREL (bupivacaine ) to help control your pain after surgery.  EXPAREL  is a local anesthetic designed to release slowly over an extended period of time to provide pain relief by numbing the tissue around the surgical site. EXPAREL  is designed to release pain medication over time and can control pain for up to 72 hours. Depending on how you  respond to EXPAREL , you may require less pain medication during your recovery. EXPAREL  can help reduce or eliminate the need for opioids during the first few days after surgery when pain relief is needed the most. EXPAREL  is not an opioid and is not addictive. It does not cause sleepiness or sedation.   Important! A teal colored band has been placed on your arm with the date, time and amount of EXPAREL  you have received. Please leave this armband in place for the full 96 hours following administration, and then you may remove the band. If you return to the hospital for any reason within 96 hours following the administration of EXPAREL , the armband provides important information that your health care providers to know, and alerts them that you have received this anesthetic.    Possible side effects of EXPAREL : Temporary loss of sensation or ability to move in the area where medication was injected. Nausea, vomiting, constipation Rarely, numbness and tingling in your mouth or lips, lightheadedness, or anxiety may occur. Call your doctor right away if you think you may be  experiencing any of these sensations, or if you have other questions regarding possible side effects.  Follow all other discharge instructions given to you by your surgeon or nurse. Eat a healthy diet and drink plenty of water or other fluids.  Tylenol  can be taken after 5 pm if needed

## 2024-06-20 NOTE — Anesthesia Postprocedure Evaluation (Signed)
 Anesthesia Post Note  Patient: Amanda Woodard  Procedure(s) Performed: BREAST REDUCTION WITH LIPOSUCTION (Bilateral: Breast)     Patient location during evaluation: PACU Anesthesia Type: General Level of consciousness: awake and alert Pain management: pain level controlled Vital Signs Assessment: post-procedure vital signs reviewed and stable Respiratory status: spontaneous breathing, nonlabored ventilation and respiratory function stable Cardiovascular status: blood pressure returned to baseline and stable Postop Assessment: no apparent nausea or vomiting Anesthetic complications: no   No notable events documented.  Last Vitals:  Vitals:   06/20/24 1315 06/20/24 1348  BP: 110/74 114/79  Pulse: 78 79  Resp: 10 14  Temp:  (!) 36.4 C  SpO2: 96% 95%    Last Pain:  Vitals:   06/20/24 1405  TempSrc:   PainSc: 4                  Butler Levander Pinal

## 2024-06-20 NOTE — Transfer of Care (Signed)
 Immediate Anesthesia Transfer of Care Note  Patient: Amanda Woodard  Procedure(s) Performed: BREAST REDUCTION WITH LIPOSUCTION (Bilateral: Breast)  Patient Location: PACU  Anesthesia Type:General  Level of Consciousness: awake, alert , and oriented  Airway & Oxygen Therapy: Patient Spontanous Breathing and Patient connected to face mask oxygen  Post-op Assessment: Report given to RN and Post -op Vital signs reviewed and stable  Post vital signs: Reviewed and stable  Last Vitals:  Vitals Value Taken Time  BP 121/73 06/20/24 12:36  Temp    Pulse 84 06/20/24 12:38  Resp 11 06/20/24 12:38  SpO2 98 % 06/20/24 12:38  Vitals shown include unfiled device data.  Last Pain:  Vitals:   06/20/24 0943  TempSrc: Temporal  PainSc: 0-No pain      Patients Stated Pain Goal: 4 (06/20/24 0943)  Complications: No notable events documented.

## 2024-06-20 NOTE — Interval H&P Note (Signed)
 History and Physical Interval Note:  06/20/2024 9:54 AM  Amanda Woodard  has presented today for surgery, with the diagnosis of Hypertrophy of breast.  The various methods of treatment have been discussed with the patient and family. After consideration of risks, benefits and other options for treatment, the patient has consented to  Procedure(s): BREAST REDUCTION WITH LIPOSUCTION (Bilateral) as a surgical intervention.  The patient's history has been reviewed, patient examined, no change in status, stable for surgery.  I have reviewed the patient's chart and labs.  Questions were answered to the patient's satisfaction.     Estefana RAMAN [name]

## 2024-06-20 NOTE — Anesthesia Preprocedure Evaluation (Addendum)
 Anesthesia Evaluation  Patient identified by MRN, date of birth, ID band Patient awake    Reviewed: Allergy & Precautions, H&P , NPO status , Patient's Chart, lab work & pertinent test results  Airway Mallampati: II   Neck ROM: full    Dental  (+) Dental Advisory Given   Pulmonary neg pulmonary ROS   breath sounds clear to auscultation       Cardiovascular hypertension,  Rhythm:regular Rate:Normal     Neuro/Psych  PSYCHIATRIC DISORDERS Anxiety Depression       GI/Hepatic   Endo/Other    Class 3 obesity  Renal/GU      Musculoskeletal   Abdominal   Peds  Hematology   Anesthesia Other Findings   Reproductive/Obstetrics                              Anesthesia Physical Anesthesia Plan  ASA: 2  Anesthesia Plan: General   Post-op Pain Management:    Induction: Intravenous  PONV Risk Score and Plan: 3 and Ondansetron , Dexamethasone , Midazolam  and Treatment may vary due to age or medical condition  Airway Management Planned: LMA  Additional Equipment:   Intra-op Plan:   Post-operative Plan: Extubation in OR  Informed Consent: I have reviewed the patients History and Physical, chart, labs and discussed the procedure including the risks, benefits and alternatives for the proposed anesthesia with the patient or authorized representative who has indicated his/her understanding and acceptance.     Dental advisory given  Plan Discussed with: CRNA, Anesthesiologist and Surgeon  Anesthesia Plan Comments:         Anesthesia Quick Evaluation

## 2024-06-20 NOTE — Op Note (Signed)
 Breast Reduction Op note:    DATE OF PROCEDURE: 06/20/2024  LOCATION: [facility]  SURGEON: Estefana Fritter, DO  ASSISTANT: Estefana Peck, PA  PREOPERATIVE DIAGNOSIS 1. Macromastia 2. Neck Pain 3. Back Pain  POSTOPERATIVE DIAGNOSIS 1. Macromastia 2. Neck Pain 3. Back Pain  PROCEDURES 1. Bilateral breast reduction.  Right reduction 661 g, Left reduction 679 g  COMPLICATIONS: None.  DRAINS: none  INDICATIONS FOR PROCEDURE Amanda Woodard is a 58 y.o. year-old female born on 11/15/1965,with a history of symptomatic macromastia with concomitant back pain, neck pain, shoulder grooving from her bra.   MRN: [id number]  CONSENT Informed consent was obtained directly from the patient. The risks, benefits and alternatives were fully discussed. Specific risks including but not limited to bleeding, infection, hematoma, seroma, scarring, pain, nipple necrosis, asymmetry, poor cosmetic results, and need for further surgery were discussed. The patient's questions were answered.  DESCRIPTION OF PROCEDURE  Patient was brought into the operating room and rested on the operating room table in the supine position.  SCDs were placed and appropriate padding was performed.  Antibiotics were given. The patient underwent general anesthesia and the chest was prepped and draped in a sterile fashion.  A timeout was performed and all information was confirmed to be correct by those in the room. Tumescent was placed in the lateral breasts on each side.  Liposuction was done for improved contour and symmetry.  Right side: Preoperative markings were confirmed.  Incision lines were injected with local containing epinephrine.  After waiting for vasoconstriction, the marked lines were incised with a #15 blade.  A Wise-pattern superomedial breast reduction was performed by de-epithelializing the pedicle, using bovie to create the superomedial pedicle, and removing breast tissue from the  superior, lateral, and inferior portions of the breast. Myriad and Experel were placed in the pocket. Care was taken to not undermine the breast pedicle. Hemostasis was achieved.  The nipple was gently rotated into position and the soft tissue closed with 4-0 Monocryl.   The pocket was irrigated and hemostasis confirmed.  The deep tissues were approximated with 3-0 PDS sutures.  The skin was closed with deep dermal 3-0 Monocryl and subcuticular 4-0 Monocryl sutures.  The nipple and skin flaps had good capillary refill at the end of the procedure.    Left side: Preoperative markings were confirmed.  Incision lines were injected with local containing epinephrine.  After waiting for vasoconstriction, the marked lines were incised with a #15 blade.  A Wise-pattern superomedial breast reduction was performed by de-epithelializing the pedicle, using bovie to create the superomedial pedicle, and removing breast tissue from the superior, lateral, and inferior portions of the breast. Myriad and Experel were placed in the pocket. Care was taken to not undermine the breast pedicle. Hemostasis was achieved.  The nipple was gently rotated into position and the soft tissue was closed with 4-0 Monocryl.  The patient was sat upright and size and shape symmetry was confirmed.  The pocket was irrigated and hemostasis confirmed.  The deep tissues were approximated with 3-0 PDS sutures. The skin was closed with deep dermal 3-0 Monocryl and subcuticular 4-0 Monocryl sutures.  Dermabond was applied.  A breast binder and ABDs were placed.  The nipple and skin flaps had good capillary refill at the end of the procedure.  The patient tolerated the procedure well. The patient was allowed to wake from anesthesia and taken to the recovery room in satisfactory condition.  The advanced practice practitioner (APP) assisted  throughout the case.  The APP was essential in retraction and counter traction when needed to make the case progress  smoothly.  This retraction and assistance made it possible to see the tissue plans for the procedure.  The assistance was needed for blood control, tissue re-approximation and assisted with closure of the incision site.

## 2024-06-21 ENCOUNTER — Encounter (HOSPITAL_BASED_OUTPATIENT_CLINIC_OR_DEPARTMENT_OTHER): Payer: Self-pay | Admitting: Plastic Surgery

## 2024-06-21 ENCOUNTER — Encounter: Admitting: Physician Assistant

## 2024-06-24 DIAGNOSIS — Z719 Counseling, unspecified: Secondary | ICD-10-CM

## 2024-06-24 LAB — SURGICAL PATHOLOGY

## 2024-06-28 ENCOUNTER — Encounter: Payer: Self-pay | Admitting: Plastic Surgery

## 2024-06-28 ENCOUNTER — Ambulatory Visit: Admitting: Plastic Surgery

## 2024-06-28 VITALS — BP 119/79 | HR 94 | Ht 65.0 in | Wt 182.0 lb

## 2024-06-28 DIAGNOSIS — Z9889 Other specified postprocedural states: Secondary | ICD-10-CM

## 2024-06-28 DIAGNOSIS — N62 Hypertrophy of breast: Secondary | ICD-10-CM

## 2024-06-28 NOTE — Progress Notes (Signed)
 The patient is a 58 year old female here for follow-up after undergoing bilateral breast reduction with approximately 700 g removed from both breasts.  Overall she is doing really well.  She has some bruising and swelling as expected.  No sign of a hematoma or seroma.  Continue with the lipo foam and the sports bra.  Plan to see her back in a week and we will get pictures at that time.

## 2024-07-11 NOTE — Progress Notes (Signed)
 Patient is a 58 year old female who underwent bilateral breast reduction with Dr. Lowery on 06/20/2024.  Patient is about 3 weeks postop.  She presents to the clinic today for postoperative follow-up.  Patient was last seen in the clinic on 06/28/2024.  At this visit, patient was overall doing really well.  She had some bruising and swelling as expected.  There was no sign of hematoma or seroma.  Today,

## 2024-07-12 ENCOUNTER — Ambulatory Visit: Admitting: Student

## 2024-07-12 ENCOUNTER — Ambulatory Visit: Admitting: Nurse Practitioner

## 2024-07-12 VITALS — BP 122/84 | HR 87

## 2024-07-12 DIAGNOSIS — N62 Hypertrophy of breast: Secondary | ICD-10-CM

## 2024-07-18 ENCOUNTER — Encounter: Admitting: Physician Assistant

## 2024-07-23 ENCOUNTER — Ambulatory Visit: Admitting: Student

## 2024-07-23 ENCOUNTER — Encounter: Payer: Self-pay | Admitting: Student

## 2024-07-23 VITALS — BP 139/83 | HR 87 | Temp 98.1°F

## 2024-07-23 DIAGNOSIS — N62 Hypertrophy of breast: Secondary | ICD-10-CM

## 2024-07-23 NOTE — Progress Notes (Signed)
 Patient is a 58 year old female who underwent bilateral breast reduction with Dr. Lowery on 06/20/2024.  Patient is almost 5 weeks postop.  She presents to the clinic today for postoperative follow-up.  Patient was last seen in the clinic on 07/12/2024.  At this visit, patient was overall doing well.  On exam, breasts were soft and symmetric.  Skin did appear to be a little bit dry.  NAC's were healthy.  Incisions were intact and healing well.    Today, reports she is doing well.  She states that the itching is much improved.  She states that it is helpful that the Steri-Strips are off and that she has been applying Vaseline.  She does state that she still little bit sore.  She does not report any infectious symptoms.  She denies any other issues or concerns.  Chaperone present on exam.  On exam, patient is sitting upright in no acute distress.  Breasts are overall soft and fairly symmetric, left is slightly larger than the right.  Skin is not taut to either breast.  There is no overlying erythema.  No obvious fluid collection on exam.  There is a little bit of firmness noted to the lateral breasts bilaterally.  Suspect that this might be scar tissue versus fat necrosis.  There are no overlying skin changes.  NAC's appear to be healthy.  Incisions are overall intact and healing well.  Several Monocryl sutures were cut and removed.  Patient tolerated well.  There was a little bit of irritation around the Monocryl sutures.  No signs of infection on exam.  Recommended that patient continue with Vaseline for now.  Also recommended she massage to the areas of firmness.  Patient expressed understanding.  Left breast is slightly larger than the right.  I discussed with the patient that there is no obvious fluid collection palpated on exam, but I would like her to continue to closely monitor her breasts and continue with her compression for now.  We did discuss the possibility of slight asymmetry after her  surgery, which is not uncommon.  Preoperative photos do show that the left breast was slightly larger than the right.  Patient to follow back up in 2 weeks.  I instructed her to call in the meantime if she has any questions or concerns about anything.

## 2024-08-02 ENCOUNTER — Encounter: Admitting: Plastic Surgery

## 2024-08-06 ENCOUNTER — Encounter: Admitting: Student

## 2024-08-12 ENCOUNTER — Encounter: Admitting: Physician Assistant

## 2024-08-20 ENCOUNTER — Telehealth: Payer: Self-pay | Admitting: Student

## 2024-08-20 ENCOUNTER — Encounter: Payer: Self-pay | Admitting: Plastic Surgery

## 2024-08-20 ENCOUNTER — Ambulatory Visit: Admitting: Plastic Surgery

## 2024-08-20 VITALS — BP 154/90 | HR 89 | Ht 65.0 in | Wt 184.0 lb

## 2024-08-20 DIAGNOSIS — Z9889 Other specified postprocedural states: Secondary | ICD-10-CM

## 2024-08-20 DIAGNOSIS — N62 Hypertrophy of breast: Secondary | ICD-10-CM

## 2024-08-20 NOTE — Progress Notes (Signed)
 The patient is a 58 year old female here for follow-up after undergoing bilateral breast reduction.  She had over 600 g removed from both breasts.  She thought maybe she had some redness around the nipple areola.  That is actually healing and not any sign of cellulitis.  No sign of hematoma or seroma.  She is healing very nicely.  She is almost ready to go back to work.  Pictures were obtained of the patient and placed in the chart with the patient's or guardian's permission.

## 2024-08-20 NOTE — Telephone Encounter (Signed)
 Pt is here today for a post op, she has an [employer]ml[employer] return to work form in her chart she was wondering when it could be filled out. The 7th business day is 08-22-24 but she leaves for a trip tomorrow and needs it filled out today
